# Patient Record
Sex: Female | Born: 1950
Health system: Southern US, Community
[De-identification: ages and names within clinical notes are randomized; demographics above are authoritative.]

## PROBLEM LIST (undated history)

## (undated) DIAGNOSIS — E079 Disorder of thyroid, unspecified: Secondary | ICD-10-CM

## (undated) DIAGNOSIS — E785 Hyperlipidemia, unspecified: Secondary | ICD-10-CM

## (undated) DIAGNOSIS — K649 Unspecified hemorrhoids: Secondary | ICD-10-CM

## (undated) DIAGNOSIS — R0602 Shortness of breath: Secondary | ICD-10-CM

## (undated) HISTORY — DX: Disorder of thyroid, unspecified: E07.9

## (undated) HISTORY — DX: Hyperlipidemia, unspecified: E78.5

## (undated) HISTORY — DX: Shortness of breath: R06.02

## (undated) HISTORY — DX: Unspecified hemorrhoids: K64.9

---

## 1998-10-06 ENCOUNTER — Other Ambulatory Visit: Admission: RE | Admit: 1998-10-06 | Discharge: 1998-10-06 | Payer: Self-pay | Admitting: Obstetrics and Gynecology

## 1999-01-04 ENCOUNTER — Encounter (INDEPENDENT_AMBULATORY_CARE_PROVIDER_SITE_OTHER): Payer: Self-pay | Admitting: Specialist

## 1999-01-04 ENCOUNTER — Other Ambulatory Visit: Admission: RE | Admit: 1999-01-04 | Discharge: 1999-01-04 | Payer: Self-pay | Admitting: Obstetrics and Gynecology

## 2001-01-10 ENCOUNTER — Emergency Department (HOSPITAL_COMMUNITY): Admission: EM | Admit: 2001-01-10 | Discharge: 2001-01-10 | Payer: Self-pay | Admitting: Emergency Medicine

## 2001-01-10 ENCOUNTER — Encounter: Payer: Self-pay | Admitting: Internal Medicine

## 2001-05-21 ENCOUNTER — Other Ambulatory Visit: Admission: RE | Admit: 2001-05-21 | Discharge: 2001-05-21 | Payer: Self-pay | Admitting: Obstetrics and Gynecology

## 2002-08-31 ENCOUNTER — Other Ambulatory Visit: Admission: RE | Admit: 2002-08-31 | Discharge: 2002-08-31 | Payer: Self-pay | Admitting: Obstetrics and Gynecology

## 2004-01-25 ENCOUNTER — Other Ambulatory Visit: Admission: RE | Admit: 2004-01-25 | Discharge: 2004-01-25 | Payer: Self-pay | Admitting: Obstetrics and Gynecology

## 2005-05-08 ENCOUNTER — Other Ambulatory Visit: Admission: RE | Admit: 2005-05-08 | Discharge: 2005-05-08 | Payer: Self-pay | Admitting: Obstetrics and Gynecology

## 2006-08-12 ENCOUNTER — Other Ambulatory Visit: Admission: RE | Admit: 2006-08-12 | Discharge: 2006-08-12 | Payer: Self-pay | Admitting: Obstetrics and Gynecology

## 2007-10-08 ENCOUNTER — Encounter: Admission: RE | Admit: 2007-10-08 | Discharge: 2007-10-08 | Payer: Self-pay | Admitting: Neurological Surgery

## 2008-02-24 ENCOUNTER — Other Ambulatory Visit: Admission: RE | Admit: 2008-02-24 | Discharge: 2008-02-24 | Payer: Self-pay | Admitting: Obstetrics and Gynecology

## 2010-09-04 ENCOUNTER — Other Ambulatory Visit (HOSPITAL_COMMUNITY)
Admission: RE | Admit: 2010-09-04 | Discharge: 2010-09-04 | Disposition: A | Discharge status: Home / Self Care | Payer: BC Managed Care – PPO | Source: Ambulatory Visit | Attending: Obstetrics and Gynecology | Admitting: Obstetrics and Gynecology

## 2010-09-04 ENCOUNTER — Encounter (INDEPENDENT_AMBULATORY_CARE_PROVIDER_SITE_OTHER): Payer: BC Managed Care – PPO | Admitting: Obstetrics and Gynecology

## 2010-09-04 ENCOUNTER — Other Ambulatory Visit: Payer: Self-pay | Admitting: Obstetrics and Gynecology

## 2010-09-04 DIAGNOSIS — Z01419 Encounter for gynecological examination (general) (routine) without abnormal findings: Secondary | ICD-10-CM

## 2010-09-04 DIAGNOSIS — Z13220 Encounter for screening for lipoid disorders: Secondary | ICD-10-CM

## 2010-09-04 DIAGNOSIS — Z124 Encounter for screening for malignant neoplasm of cervix: Secondary | ICD-10-CM | POA: Insufficient documentation

## 2010-09-29 ENCOUNTER — Other Ambulatory Visit: Payer: BC Managed Care – PPO

## 2010-10-17 ENCOUNTER — Other Ambulatory Visit: Payer: Self-pay | Admitting: *Deleted

## 2010-10-17 DIAGNOSIS — E785 Hyperlipidemia, unspecified: Secondary | ICD-10-CM

## 2010-10-17 MED ORDER — ATORVASTATIN CALCIUM 20 MG PO TABS: 20.0000 mg | ORAL_TABLET | Freq: Every day | ORAL | Status: AC

## 2010-12-08 ENCOUNTER — Other Ambulatory Visit: Payer: Self-pay | Admitting: *Deleted

## 2010-12-08 DIAGNOSIS — E785 Hyperlipidemia, unspecified: Secondary | ICD-10-CM

## 2010-12-11 ENCOUNTER — Other Ambulatory Visit (INDEPENDENT_AMBULATORY_CARE_PROVIDER_SITE_OTHER): Payer: BC Managed Care – PPO | Admitting: *Deleted

## 2010-12-11 DIAGNOSIS — R5383 Other fatigue: Secondary | ICD-10-CM

## 2010-12-11 DIAGNOSIS — R5381 Other malaise: Secondary | ICD-10-CM

## 2010-12-11 DIAGNOSIS — E785 Hyperlipidemia, unspecified: Secondary | ICD-10-CM

## 2010-12-11 LAB — LIPID PANEL
Cholesterol: 179 mg/dL (ref 0–200)
LDL Cholesterol: 87 mg/dL (ref 0–99)
Triglycerides: 106 mg/dL (ref 0.0–149.0)
VLDL: 21.2 mg/dL (ref 0.0–40.0)

## 2010-12-11 LAB — BASIC METABOLIC PANEL
BUN: 18 mg/dL (ref 6–23)
Creatinine, Ser: 1 mg/dL (ref 0.4–1.2)
GFR: 59.65 mL/min — ABNORMAL LOW (ref >60.00)
Potassium: 4.3 mEq/L (ref 3.5–5.1)

## 2010-12-11 LAB — HEPATIC FUNCTION PANEL: Total Bilirubin: 0.6 mg/dL (ref 0.3–1.2)

## 2010-12-15 ENCOUNTER — Telehealth: Payer: Self-pay | Admitting: *Deleted

## 2010-12-15 NOTE — Telephone Encounter (Signed)
Message copied by Adolphus Birchwood on Fri Dec 15, 2010  4:08 PM ------      Message from: Roger Shelter      Created: Fri Dec 15, 2010  1:32 PM       Much improvement, recheck in six months with PCP.

## 2010-12-15 NOTE — Telephone Encounter (Signed)
Left message with lab results and for pt to f/u with PCP in six months for labs.  Pt told to call back with any concerns.

## 2011-02-14 ENCOUNTER — Telehealth: Payer: Self-pay | Admitting: Nurse Practitioner

## 2011-02-14 NOTE — Telephone Encounter (Signed)
Received Auth to Release Med Rec Info to [facility] requesting last 2 years of Notes & Labs, records show the only info in chart are Lab Results for 12/11/2010 within the last 2 year period, faxed Lab Results for 12/11/2010 today to [phone]

## 2011-02-27 ENCOUNTER — Telehealth: Payer: Self-pay | Admitting: Nurse Practitioner

## 2011-02-27 NOTE — Telephone Encounter (Signed)
Received auth to release med rec info for all records for past 2 years to include: OV Notes, Labs.

## 2011-02-27 NOTE — Telephone Encounter (Signed)
Faxed last few years of available OV Notes and Labs to 2007.  Faxed today.

## 2011-11-23 ENCOUNTER — Encounter: Payer: Self-pay | Admitting: Cardiology

## 2011-12-24 ENCOUNTER — Telehealth: Payer: Self-pay | Admitting: Medical Oncology

## 2011-12-24 NOTE — Telephone Encounter (Signed)
Wrong chart

## 2012-04-11 ENCOUNTER — Encounter: Payer: Self-pay | Admitting: Cardiology

## 2012-06-12 ENCOUNTER — Encounter: Payer: Self-pay | Admitting: Gastroenterology

## 2012-07-08 ENCOUNTER — Encounter: Payer: Self-pay | Admitting: Gastroenterology

## 2012-07-08 ENCOUNTER — Ambulatory Visit (INDEPENDENT_AMBULATORY_CARE_PROVIDER_SITE_OTHER): Payer: BC Managed Care – PPO | Admitting: Gastroenterology

## 2012-07-08 VITALS — BP 100/60 | HR 62 | Ht 65.0 in | Wt 151.0 lb

## 2012-07-08 DIAGNOSIS — E079 Disorder of thyroid, unspecified: Secondary | ICD-10-CM

## 2012-07-08 DIAGNOSIS — Z1211 Encounter for screening for malignant neoplasm of colon: Secondary | ICD-10-CM

## 2012-07-08 MED ORDER — PEG-KCL-NACL-NASULF-NA ASC-C 100 G PO SOLR: 1.0000 | Freq: Once | ORAL | Status: DC

## 2012-07-08 NOTE — Progress Notes (Signed)
History of Present Illness:  This is a 62 year old Caucasian female who is completely asymptomatic in terms of any general medical or gastrointestinal problems.  She is followed by Dr. Rodrigo Ran in primary care.  Patient is on Synthroid no other medications.  She specifically denies acid reflux, and history of hepatobiliary problems, bowel or regularity, melena or hematochezia.  Her appetite is good her weight is stable.  She recently did Hemoccult cards which were guaiac negative.  Family history is noncontributory.   I have reviewed this patient's present history, medical and surgical past history, allergies and medications.     ROS: The remainder of the 10 point ROS is negative     Physical Exam: Blood pressure 100/60, pulse 62 and regular, and weight 151 with a BMI of 25.13. General well developed well nourished patient in no acute distress, appearing her stated age Eyes PERRLA, no icterus, fundoscopic exam per opthamologist Skin no lesions noted Neck supple, no adenopathy, no thyroid enlargement, no tenderness Chest clear to percussion and auscultation Heart no significant murmurs, gallops or rubs noted Abdomen no hepatosplenomegaly masses or tenderness, BS normal.  Extremities no acute joint lesions, edema, phlebitis or evidence of cellulitis. Neurologic patient oriented x 3, cranial nerves intact, no focal neurologic deficits noted. Psychological mental status normal and normal affect.  Assessment and plan: I agree this patient is due for screening colonoscopy because of her age and no previous exam.  She has approximate 20% chance of having an adenomatous colon polyp.  I explained the risk and benefits of this procedure in detail, and she is agreed to proceed as planned.  We will prep her with a balanced electrolyte preparation, and use IV propofol sedation with nurse anesthesia in attendance.  Encounter Diagnoses  Name Primary?  . Screening colon Yes  . Special screening for  malignant neoplasms, colon

## 2012-07-08 NOTE — Patient Instructions (Addendum)
You have been scheduled for a colonoscopy with propofol. Please follow written instructions given to you at your visit today.  Please pick up your prep kit at the pharmacy within the next 1-3 days. If you use inhalers (even only as needed) or a CPAP machine, please bring them with you on the day of your procedure. CC:  Rodrigo Ran MD

## 2012-07-16 ENCOUNTER — Ambulatory Visit (AMBULATORY_SURGERY_CENTER): Payer: BC Managed Care – PPO | Admitting: Gastroenterology

## 2012-07-16 ENCOUNTER — Encounter: Payer: Self-pay | Admitting: Gastroenterology

## 2012-07-16 VITALS — BP 117/56 | HR 52 | Temp 98.7°F | Resp 29 | Ht 65.0 in | Wt 151.0 lb

## 2012-07-16 DIAGNOSIS — Z1211 Encounter for screening for malignant neoplasm of colon: Secondary | ICD-10-CM

## 2012-07-16 MED ORDER — SODIUM CHLORIDE 0.9 % IV SOLN: 500.0000 mL | INTRAVENOUS | Status: DC

## 2012-07-16 NOTE — Op Note (Signed)
[facility] [address] [place], [address]   COLONOSCOPY PROCEDURE REPORT  PATIENT: Teresa, Wallace  MR#: [id number] BIRTHDATE: March 19, 1951 , 61  yrs. old GENDER: Female ENDOSCOPIST: Mardella Layman, MD, [facility] REFERRED BY: PROCEDURE DATE:  07/16/2012 PROCEDURE:   Colonoscopy, screening ASA CLASS:   Class II INDICATIONS:Average risk patient for colon cancer. MEDICATIONS: propofol (Diprivan) 150mg  IV  DESCRIPTION OF PROCEDURE:   After the risks and benefits and of the procedure were explained, informed consent was obtained.  A digital rectal exam revealed no abnormalities of the rectum.    The LB [id number]  endoscope was introduced through the anus and advanced to the cecum, which was identified by both the appendix and ileocecal valve .  The quality of the prep was excellent, using MoviPrep .  The instrument was then slowly withdrawn as the colon was fully examined.     COLON FINDINGS: A normal appearing cecum, ileocecal valve, and appendiceal orifice were identified.  The ascending, hepatic flexure, transverse, splenic flexure, descending, sigmoid colon and rectum appeared unremarkable.  No polyps or cancers were seen. Retroflexed views revealed no abnormalities.     The scope was then withdrawn from the patient and the procedure completed.  COMPLICATIONS: There were no complications. ENDOSCOPIC IMPRESSION: Normal colon ...no polyps or cancer noted...  RECOMMENDATIONS: 1.  Continue current medications 2.  Continue current colorectal screening recommendations for "routine risk" patients with a repeat colonoscopy in 10 years.   REPEAT EXAM:  cc:  _______________________________ eSignedMardella Layman, MD, [facility] 07/16/2012 11:39 AM

## 2012-07-16 NOTE — Patient Instructions (Addendum)
Discharge instructions given with verbal understanding. Normal exam. Resume previous medications. YOU HAD AN ENDOSCOPIC PROCEDURE TODAY AT THE [facility]: Refer to the procedure report that was given to you for any specific questions about what was found during the examination.  If the procedure report does not answer your questions, please call your gastroenterologist to clarify.  If you requested that your care partner not be given the details of your procedure findings, then the procedure report has been included in a sealed envelope for you to review at your convenience later.  YOU SHOULD EXPECT: Some feelings of bloating in the abdomen. Passage of more gas than usual.  Walking can help get rid of the air that was put into your GI tract during the procedure and reduce the bloating. If you had a lower endoscopy (such as a colonoscopy or flexible sigmoidoscopy) you may notice spotting of blood in your stool or on the toilet paper. If you underwent a bowel prep for your procedure, then you may not have a normal bowel movement for a few days.  DIET: Your first meal following the procedure should be a light meal and then it is ok to progress to your normal diet.  A half-sandwich or bowl of soup is an example of a good first meal.  Heavy or fried foods are harder to digest and may make you feel nauseous or bloated.  Likewise meals heavy in dairy and vegetables can cause extra gas to form and this can also increase the bloating.  Drink plenty of fluids but you should avoid alcoholic beverages for 24 hours.  ACTIVITY: Your care partner should take you home directly after the procedure.  You should plan to take it easy, moving slowly for the rest of the day.  You can resume normal activity the day after the procedure however you should NOT DRIVE or use heavy machinery for 24 hours (because of the sedation medicines used during the test).    SYMPTOMS TO REPORT IMMEDIATELY: A gastroenterologist  can be reached at any hour.  During normal business hours, 8:30 AM to 5:00 PM Monday through Friday, call [phone].  After hours and on weekends, please call the GI answering service at [phone] who will take a message and have the physician on call contact you.   Following lower endoscopy (colonoscopy or flexible sigmoidoscopy):  Excessive amounts of blood in the stool  Significant tenderness or worsening of abdominal pains  Swelling of the abdomen that is new, acute  Fever of 100F or higher  FOLLOW UP: If any biopsies were taken you will be contacted by phone or by letter within the next 1-3 weeks.  Call your gastroenterologist if you have not heard about the biopsies in 3 weeks.  Our staff will call the home number listed on your records the next business day following your procedure to check on you and address any questions or concerns that you may have at that time regarding the information given to you following your procedure. This is a courtesy call and so if there is no answer at the home number and we have not heard from you through the emergency physician on call, we will assume that you have returned to your regular daily activities without incident.  SIGNATURES/CONFIDENTIALITY: You and/or your care partner have signed paperwork which will be entered into your electronic medical record.  These signatures attest to the fact that that the information above on your After Visit Summary has been reviewed   and is understood.  Full responsibility of the confidentiality of this discharge information lies with you and/or your care-partner. 

## 2012-07-16 NOTE — Progress Notes (Signed)
Patient did not experience any of the following events: a burn prior to discharge; a fall within the facility; wrong site/side/patient/procedure/implant event; or a hospital transfer or hospital admission upon discharge from the facility. (G8907) Patient did not have preoperative order for IV antibiotic SSI prophylaxis. (G8918)  

## 2012-07-16 NOTE — Progress Notes (Signed)
Lidocaine-40mg IV prior to Propofol InductionPropofol given over incremental dosages 

## 2012-07-17 ENCOUNTER — Telehealth: Payer: Self-pay

## 2012-07-17 NOTE — Telephone Encounter (Signed)
  Follow up Call-  Call back number 07/16/2012  Post procedure Call Back phone  # [phone]  Permission to leave phone message Yes     Patient questions:  Do you have a fever, pain , or abdominal swelling? no Pain Score  0 *  Have you tolerated food without any problems? yes  Have you been able to return to your normal activities? yes  Do you have any questions about your discharge instructions: Diet   no Medications  no Follow up visit  no  Do you have questions or concerns about your Care? no  Actions: * If pain score is 4 or above: No action needed, pain <4.

## 2016-07-17 ENCOUNTER — Encounter: Payer: Self-pay | Admitting: Obstetrics & Gynecology

## 2016-07-17 ENCOUNTER — Ambulatory Visit (INDEPENDENT_AMBULATORY_CARE_PROVIDER_SITE_OTHER): Payer: BLUE CROSS/BLUE SHIELD | Admitting: Obstetrics & Gynecology

## 2016-07-17 VITALS — BP 110/66 | HR 64 | Resp 14 | Ht 64.0 in | Wt 151.0 lb

## 2016-07-17 DIAGNOSIS — E785 Hyperlipidemia, unspecified: Secondary | ICD-10-CM

## 2016-07-17 DIAGNOSIS — Z01419 Encounter for gynecological examination (general) (routine) without abnormal findings: Secondary | ICD-10-CM | POA: Diagnosis not present

## 2016-07-17 DIAGNOSIS — E039 Hypothyroidism, unspecified: Secondary | ICD-10-CM | POA: Diagnosis not present

## 2016-07-17 DIAGNOSIS — Z124 Encounter for screening for malignant neoplasm of cervix: Secondary | ICD-10-CM | POA: Diagnosis not present

## 2016-07-17 NOTE — Progress Notes (Signed)
66 y.o. G2P2 MarriedCaucasianF here for annual exam. Here to establish care.  Former patient of Dr. Cherylann Banas.    PCP:  Dr. Joylene Draft  No LMP recorded. Patient is postmenopausal.          Sexually active: Yes.    The current method of family planning is post menopausal status.    Exercising: Yes.    weights Smoker:  no  Health Maintenance: Pap:  unsure History of abnormal Pap:  no MMG:  07/20/15 at [facility]  Colonoscopy:  07/16/12 normal repeat 10 years, Dr. Sharlett Iles BMD:   2015 with [facility]  TDaP:  2013 Pneumonia vaccine(s):  never  Zostavax:   PCP Hep C testing: PCP Screening Labs: PCP, Hb today: PCP, Urine today: PCP   reports that she has never smoked. She has never used smokeless tobacco. She reports that she drinks alcohol. She reports that she does not use drugs.  Past Medical History:  Diagnosis Date  . Hyperlipemia   . SOB (shortness of breath)    Mild. Associated with palpitations possibly associated anxiety.  . Thyroid disorder     History reviewed. No pertinent surgical history.  Current Outpatient Prescriptions  Medication Sig Dispense Refill  . atorvastatin (LIPITOR) 20 MG tablet Take 1 tablet (20 mg total) by mouth daily. 30 tablet 3  . levothyroxine (LEVOTHROID) 25 MCG tablet Take 25 mcg by mouth daily.     No current facility-administered medications for this visit.     Family History  Problem Relation Age of Onset  . Breast cancer Mother   . Cancer Mother 24    lung cancer  . Breast cancer Paternal Aunt   . Breast cancer Maternal Grandmother 39  . Breast cancer Paternal Grandmother 33  . Breast cancer Paternal Aunt   . Breast cancer Maternal Aunt   . Breast cancer Maternal Aunt     ROS:  Pertinent items are noted in HPI.  Otherwise, a comprehensive ROS was negative.  Exam:   BP 110/66 (BP Location: Right Arm, Patient Position: Sitting, Cuff Size: Normal)   Pulse 64   Resp 14   Ht 5\' 4"  (1.626 m)   Wt 151 lb (68.5 kg)   BMI 25.92 kg/m      Height: 5\' 4"  (162.6 cm)  Ht Readings from Last 3 Encounters:  07/17/16 5\' 4"  (1.626 m)  07/16/12 5\' 5"  (1.651 m)  07/08/12 5\' 5"  (1.651 m)    General appearance: alert, cooperative and appears stated age Head: Normocephalic, without obvious abnormality, atraumatic Neck: no adenopathy, supple, symmetrical, trachea midline and thyroid normal to inspection and palpation Lungs: clear to auscultation bilaterally Breasts: normal appearance, no masses or tenderness Heart: regular rate and rhythm Abdomen: soft, non-tender; bowel sounds normal; no masses,  no organomegaly Extremities: extremities normal, atraumatic, no cyanosis or edema Skin: Skin color, texture, turgor normal. No rashes or lesions Lymph nodes: Cervical, supraclavicular, and axillary nodes normal. No abnormal inguinal nodes palpated Neurologic: Grossly normal   Pelvic: External genitalia:  no lesions              Urethra:  normal appearing urethra with no masses, tenderness or lesions              Bartholins and Skenes: normal                 Vagina: normal appearing vagina with normal color and discharge, no lesions              Cervix: no lesions  Pap taken: Yes.   Bimanual Exam:  Uterus:  normal size, contour, position, consistency, mobility, non-tender              Adnexa: normal adnexa and no mass, fullness, tenderness               Rectovaginal: Confirms               Anus:  normal sphincter tone, no lesions  Chaperone was present for exam.  A:  Well Woman with normal exam PMP, no HRT Strong family hx of breast cancer (at least 7 family member on both mother's and father's side)   P:   Mammogram guidelines reviewed.  Pt doing 3D MMG.   Pap smear and HR HPV obtained today  Labs and vaccines done with Dr. Joylene Draft.  She will check about whether she's had Hep C testing D/W pt genetic testing for strong family hx of breast cancer.  States she will consider this and let me know if desires  referral. return annually or prn

## 2016-07-17 NOTE — Patient Instructions (Signed)
Make sure you've had the Hep C testing done.  If not, Dr. Joylene Draft can do this or our office.

## 2016-07-20 LAB — IPS PAP TEST WITH HPV

## 2017-02-13 DIAGNOSIS — G245 Blepharospasm: Secondary | ICD-10-CM | POA: Diagnosis not present

## 2017-03-14 DIAGNOSIS — E038 Other specified hypothyroidism: Secondary | ICD-10-CM | POA: Diagnosis not present

## 2017-03-14 DIAGNOSIS — E784 Other hyperlipidemia: Secondary | ICD-10-CM | POA: Diagnosis not present

## 2017-03-18 ENCOUNTER — Ambulatory Visit (HOSPITAL_COMMUNITY): Payer: Medicare Other | Attending: Cardiology

## 2017-03-18 ENCOUNTER — Other Ambulatory Visit: Payer: Self-pay | Admitting: Internal Medicine

## 2017-03-18 ENCOUNTER — Other Ambulatory Visit: Payer: Self-pay

## 2017-03-18 DIAGNOSIS — L988 Other specified disorders of the skin and subcutaneous tissue: Secondary | ICD-10-CM | POA: Diagnosis not present

## 2017-03-18 DIAGNOSIS — I081 Rheumatic disorders of both mitral and tricuspid valves: Secondary | ICD-10-CM | POA: Insufficient documentation

## 2017-03-18 DIAGNOSIS — R011 Cardiac murmur, unspecified: Secondary | ICD-10-CM | POA: Diagnosis not present

## 2017-03-18 DIAGNOSIS — Z1231 Encounter for screening mammogram for malignant neoplasm of breast: Secondary | ICD-10-CM | POA: Diagnosis not present

## 2017-03-18 DIAGNOSIS — Z803 Family history of malignant neoplasm of breast: Secondary | ICD-10-CM | POA: Diagnosis not present

## 2017-03-18 DIAGNOSIS — Z6825 Body mass index (BMI) 25.0-25.9, adult: Secondary | ICD-10-CM | POA: Diagnosis not present

## 2017-03-18 DIAGNOSIS — R002 Palpitations: Secondary | ICD-10-CM | POA: Diagnosis not present

## 2017-03-18 DIAGNOSIS — R0789 Other chest pain: Secondary | ICD-10-CM | POA: Diagnosis not present

## 2017-03-18 DIAGNOSIS — Z1389 Encounter for screening for other disorder: Secondary | ICD-10-CM | POA: Diagnosis not present

## 2017-03-18 DIAGNOSIS — E784 Other hyperlipidemia: Secondary | ICD-10-CM | POA: Diagnosis not present

## 2017-03-18 DIAGNOSIS — Z0000 Encounter for general adult medical examination without abnormal findings: Secondary | ICD-10-CM | POA: Diagnosis not present

## 2017-03-18 DIAGNOSIS — E038 Other specified hypothyroidism: Secondary | ICD-10-CM | POA: Diagnosis not present

## 2017-03-22 ENCOUNTER — Other Ambulatory Visit (HOSPITAL_COMMUNITY): Payer: Self-pay

## 2017-07-18 DIAGNOSIS — H524 Presbyopia: Secondary | ICD-10-CM | POA: Diagnosis not present

## 2017-07-18 DIAGNOSIS — H52203 Unspecified astigmatism, bilateral: Secondary | ICD-10-CM | POA: Diagnosis not present

## 2017-07-18 DIAGNOSIS — H2513 Age-related nuclear cataract, bilateral: Secondary | ICD-10-CM | POA: Diagnosis not present

## 2017-07-18 DIAGNOSIS — Z780 Asymptomatic menopausal state: Secondary | ICD-10-CM | POA: Diagnosis not present

## 2017-10-17 ENCOUNTER — Other Ambulatory Visit: Payer: Self-pay

## 2017-10-17 ENCOUNTER — Encounter: Payer: Self-pay | Admitting: Obstetrics & Gynecology

## 2017-10-17 ENCOUNTER — Ambulatory Visit (INDEPENDENT_AMBULATORY_CARE_PROVIDER_SITE_OTHER): Payer: Medicare Other | Admitting: Obstetrics & Gynecology

## 2017-10-17 VITALS — BP 110/60 | HR 70 | Resp 16 | Ht 64.0 in | Wt 154.0 lb

## 2017-10-17 DIAGNOSIS — Z01419 Encounter for gynecological examination (general) (routine) without abnormal findings: Secondary | ICD-10-CM

## 2017-10-17 DIAGNOSIS — Z124 Encounter for screening for malignant neoplasm of cervix: Secondary | ICD-10-CM | POA: Diagnosis not present

## 2017-10-17 MED ORDER — LIDOCAINE 5 % EX OINT: 1.0000 "application " | TOPICAL_OINTMENT | Freq: Three times a day (TID) | CUTANEOUS | 0 refills | Status: DC

## 2017-10-17 MED ORDER — HYDROCORTISONE 1 % RE CREA: TOPICAL_CREAM | RECTAL | 1 refills | Status: DC

## 2017-10-17 NOTE — Progress Notes (Signed)
67 y.o. G2P0 MarriedCaucasianF here for annual exam.  Doing well.  Denies vaginal bleeding.  Having some issues with hemorrhoids.  Has experienced a little rectal bleeding this week.  PCP:  Dr. Joylene Draft.  Had blood work done in January.    No LMP recorded. Patient is postmenopausal.          Sexually active: Yes.    The current method of family planning is post menopausal status.    Exercising: Yes.    weights Smoker:  no  Health Maintenance: Pap:  07/17/16 Neg. HR HPV:neg   09/04/10 Neg  History of abnormal Pap:  no MMG: 03/18/17 Normal [facility].   Colonoscopy:  07/16/12 Normal. F/u 10 years BMD:   07/2017 Normal TDaP:  2013  Pneumonia vaccine(s):  Started with Dr. Joylene Draft Shingrix: Zostavax done  Hep C testing: PCP Screening Labs: PCP   reports that she has never smoked. She has never used smokeless tobacco. She reports that she drinks alcohol. She reports that she does not use drugs.  Past Medical History:  Diagnosis Date  . Hemorrhoids   . Hyperlipemia   . SOB (shortness of breath)    Mild. Associated with palpitations possibly associated anxiety.  . Thyroid disorder     History reviewed. No pertinent surgical history.  Current Outpatient Medications  Medication Sig Dispense Refill  . atorvastatin (LIPITOR) 20 MG tablet Take 1 tablet (20 mg total) by mouth daily. 30 tablet 3   No current facility-administered medications for this visit.     Family History  Problem Relation Age of Onset  . Breast cancer Mother   . Lung cancer Mother 19  . Breast cancer Paternal Aunt   . Breast cancer Maternal Grandmother 61  . Breast cancer Paternal Grandmother 59  . Breast cancer Paternal Aunt   . Breast cancer Maternal Aunt   . Breast cancer Maternal Aunt     Review of Systems  All other systems reviewed and are negative.   Exam:   BP 110/60 (BP Location: Right Arm, Patient Position: Sitting, Cuff Size: Large)   Pulse 70   Resp 16   Ht 5\' 4"  (1.626 m)   Wt 154 lb (69.9 kg)    BMI 26.43 kg/m     Height: 5\' 4"  (162.6 cm)  Ht Readings from Last 3 Encounters:  10/17/17 5\' 4"  (1.626 m)  07/17/16 5\' 4"  (1.626 m)  07/16/12 5\' 5"  (1.651 m)    General appearance: alert, cooperative and appears stated age Head: Normocephalic, without obvious abnormality, atraumatic Neck: no adenopathy, supple, symmetrical, trachea midline and thyroid normal to inspection and palpation Lungs: clear to auscultation bilaterally Breasts: normal appearance, no masses or tenderness Heart: regular rate and rhythm Abdomen: soft, non-tender; bowel sounds normal; no masses,  no organomegaly Extremities: extremities normal, atraumatic, no cyanosis or edema Skin: Skin color, texture, turgor normal. No rashes or lesions Lymph nodes: Cervical, supraclavicular, and axillary nodes normal. No abnormal inguinal nodes palpated Neurologic: Grossly normal  Pelvic: External genitalia:  no lesions              Urethra:  normal appearing urethra with no masses, tenderness or lesions              Bartholins and Skenes: normal                 Vagina: normal appearing vagina with normal color and discharge, no lesions              Cervix: no  lesions              Pap taken: No. Bimanual Exam:  Uterus:  normal size, contour, position, consistency, mobility, non-tender              Adnexa: normal adnexa and no mass, fullness, tenderness               Rectovaginal: Confirms               Anus:  normal sphincter tone, hemorrhoids  Chaperone was present for exam.  A:  Well Woman with normal exam PMP, no HRT Strong family hx of breast cancer.  [employer] model done today.  15.2% lifetime risk of breast cancer done today based on family members ages given today.  Pt guessed about several family members ages.    P:   Mammogram guidelines reviewed.   pap smear 2018.  Not indicated today D/w pt genetic testing.  She wants to discuss this with her daughter.   Return annually or prn

## 2017-11-01 DIAGNOSIS — Z1212 Encounter for screening for malignant neoplasm of rectum: Secondary | ICD-10-CM | POA: Diagnosis not present

## 2018-04-22 DIAGNOSIS — R82998 Other abnormal findings in urine: Secondary | ICD-10-CM | POA: Diagnosis not present

## 2018-04-22 DIAGNOSIS — E038 Other specified hypothyroidism: Secondary | ICD-10-CM | POA: Diagnosis not present

## 2018-04-22 DIAGNOSIS — E7849 Other hyperlipidemia: Secondary | ICD-10-CM | POA: Diagnosis not present

## 2018-04-29 DIAGNOSIS — Z23 Encounter for immunization: Secondary | ICD-10-CM | POA: Diagnosis not present

## 2018-04-29 DIAGNOSIS — R011 Cardiac murmur, unspecified: Secondary | ICD-10-CM | POA: Diagnosis not present

## 2018-04-29 DIAGNOSIS — E7849 Other hyperlipidemia: Secondary | ICD-10-CM | POA: Diagnosis not present

## 2018-04-29 DIAGNOSIS — E038 Other specified hypothyroidism: Secondary | ICD-10-CM | POA: Diagnosis not present

## 2018-04-29 DIAGNOSIS — Z1389 Encounter for screening for other disorder: Secondary | ICD-10-CM | POA: Diagnosis not present

## 2018-04-29 DIAGNOSIS — L988 Other specified disorders of the skin and subcutaneous tissue: Secondary | ICD-10-CM | POA: Diagnosis not present

## 2018-04-29 DIAGNOSIS — R002 Palpitations: Secondary | ICD-10-CM | POA: Diagnosis not present

## 2018-04-29 DIAGNOSIS — Z6825 Body mass index (BMI) 25.0-25.9, adult: Secondary | ICD-10-CM | POA: Diagnosis not present

## 2018-04-29 DIAGNOSIS — D7589 Other specified diseases of blood and blood-forming organs: Secondary | ICD-10-CM | POA: Diagnosis not present

## 2018-04-29 DIAGNOSIS — Z0000 Encounter for general adult medical examination without abnormal findings: Secondary | ICD-10-CM | POA: Diagnosis not present

## 2018-05-07 DIAGNOSIS — Z1212 Encounter for screening for malignant neoplasm of rectum: Secondary | ICD-10-CM | POA: Diagnosis not present

## 2018-07-24 DIAGNOSIS — H2513 Age-related nuclear cataract, bilateral: Secondary | ICD-10-CM | POA: Diagnosis not present

## 2018-07-24 DIAGNOSIS — H52203 Unspecified astigmatism, bilateral: Secondary | ICD-10-CM | POA: Diagnosis not present

## 2018-07-24 DIAGNOSIS — H524 Presbyopia: Secondary | ICD-10-CM | POA: Diagnosis not present

## 2018-10-27 ENCOUNTER — Ambulatory Visit: Payer: Medicare Other | Admitting: Obstetrics & Gynecology

## 2019-01-01 NOTE — Progress Notes (Signed)
68 y.o. G2P0 Married White or Caucasian female here for annual exam.  Doing well.  Denies vaginal bleeding.    PCP:  Dr. Joylene Draft.  Last visit was in october  No LMP recorded. Patient is postmenopausal.          Sexually active: Yes.    The current method of family planning is post menopausal status.    Exercising: Yes.    Golf Smoker:  no  Health Maintenance: Pap:  07/17/16 neg History of abnormal Pap:  no MMG:  03/18/17 bi-rads 1 neg.  Reports she had one scheduled.   Colonoscopy: 07/16/12 normal repeat 10 years, Dr. Sharlett Iles  BMD:  07/2017 normal  TDaP:  2013 Pneumonia vaccine(s):  Started with Dr Joylene Draft per patient  Shingrix:  Completed 2019 Hep C testing: PCP Screening Labs: PCP   reports that she has never smoked. She has never used smokeless tobacco. She reports current alcohol use. She reports that she does not use drugs.  Past Medical History:  Diagnosis Date  . Hemorrhoids   . Hyperlipemia   . SOB (shortness of breath)    Mild. Associated with palpitations possibly associated anxiety.  . Thyroid disorder     No past surgical history on file.  Current Outpatient Medications  Medication Sig Dispense Refill  . atorvastatin (LIPITOR) 20 MG tablet Take 1 tablet (20 mg total) by mouth daily. 30 tablet 3  . hydrocortisone (PROCTO-PAK) 1 % CREA Apply topically up to 4 times daily 28.4 g 1  . lidocaine (XYLOCAINE) 5 % ointment Apply 1 application topically 3 (three) times daily. 1.25 g 0   No current facility-administered medications for this visit.     Family History  Problem Relation Age of Onset  . Breast cancer Mother   . Lung cancer Mother 53  . Breast cancer Paternal Aunt   . Breast cancer Maternal Grandmother 37  . Breast cancer Paternal Grandmother 67  . Breast cancer Paternal Aunt   . Breast cancer Maternal Aunt   . Breast cancer Maternal Aunt     Review of Systems  All other systems reviewed and are negative.   Exam:   Vitals:   01/06/19 1314  BP:  110/64  Pulse: 74  Resp: 14  Temp: 98.3 F (36.8 C)    General appearance: alert, cooperative and appears stated age Head: Normocephalic, without obvious abnormality, atraumatic Neck: no adenopathy, supple, symmetrical, trachea midline and thyroid normal to inspection and palpation Lungs: clear to auscultation bilaterally Breasts: normal appearance, no masses or tenderness Heart: regular rate and rhythm Abdomen: soft, non-tender; bowel sounds normal; no masses,  no organomegaly Extremities: extremities normal, atraumatic, no cyanosis or edema Skin: Skin color, texture, turgor normal. No rashes or lesions Lymph nodes: Cervical, supraclavicular, and axillary nodes normal. No abnormal inguinal nodes palpated Neurologic: Grossly normal   Pelvic: External genitalia:  no lesions              Urethra:  normal appearing urethra with no masses, tenderness or lesions              Bartholins and Skenes: normal                 Vagina: normal appearing vagina with normal color and discharge, no lesions              Cervix: no lesions              Pap taken: Yes.   Bimanual Exam:  Uterus:  normal  size, contour, position, consistency, mobility, non-tender              Adnexa: normal adnexa and no mass, fullness, tenderness               Rectovaginal: Confirms               Anus:  normal sphincter tone, no lesions  Chaperone was present for exam.  A:  Well Woman with normal exam PMP, no HRT Strong family hx of breast cancer.  15.2% lifetime risk of breast cancer.  P:   Mammogram yearly recommended.  She knows this is overdue.  Limited breast MRI discussed.  She is interested in proceeding with this. pap smear obtained today Lab work done 04/2018 with Dr. Joylene Draft Vaccines are UTD Colonoscopy and BMD are UTD Return annually or prn

## 2019-01-06 ENCOUNTER — Other Ambulatory Visit (HOSPITAL_COMMUNITY)
Admission: RE | Admit: 2019-01-06 | Discharge: 2019-01-06 | Disposition: A | Discharge status: Home / Self Care | Payer: Medicare Other | Source: Ambulatory Visit | Attending: Obstetrics & Gynecology | Admitting: Obstetrics & Gynecology

## 2019-01-06 ENCOUNTER — Other Ambulatory Visit: Payer: Self-pay

## 2019-01-06 ENCOUNTER — Encounter: Payer: Self-pay | Admitting: Obstetrics & Gynecology

## 2019-01-06 ENCOUNTER — Ambulatory Visit (INDEPENDENT_AMBULATORY_CARE_PROVIDER_SITE_OTHER): Payer: Medicare Other | Admitting: Obstetrics & Gynecology

## 2019-01-06 VITALS — BP 110/64 | HR 74 | Temp 98.3°F | Resp 14 | Ht 64.25 in | Wt 147.0 lb

## 2019-01-06 DIAGNOSIS — Z124 Encounter for screening for malignant neoplasm of cervix: Secondary | ICD-10-CM | POA: Diagnosis not present

## 2019-01-06 DIAGNOSIS — Z01419 Encounter for gynecological examination (general) (routine) without abnormal findings: Secondary | ICD-10-CM

## 2019-01-07 LAB — CYTOLOGY - PAP: Diagnosis: NEGATIVE

## 2019-01-09 ENCOUNTER — Ambulatory Visit: Payer: Medicare Other | Admitting: Obstetrics & Gynecology

## 2019-04-21 DIAGNOSIS — E038 Other specified hypothyroidism: Secondary | ICD-10-CM | POA: Diagnosis not present

## 2019-04-21 DIAGNOSIS — E7849 Other hyperlipidemia: Secondary | ICD-10-CM | POA: Diagnosis not present

## 2019-04-21 DIAGNOSIS — Z23 Encounter for immunization: Secondary | ICD-10-CM | POA: Diagnosis not present

## 2019-04-23 DIAGNOSIS — R8290 Unspecified abnormal findings in urine: Secondary | ICD-10-CM | POA: Diagnosis not present

## 2019-04-23 DIAGNOSIS — Z1212 Encounter for screening for malignant neoplasm of rectum: Secondary | ICD-10-CM | POA: Diagnosis not present

## 2019-04-27 DIAGNOSIS — R011 Cardiac murmur, unspecified: Secondary | ICD-10-CM | POA: Diagnosis not present

## 2019-04-27 DIAGNOSIS — Z0000 Encounter for general adult medical examination without abnormal findings: Secondary | ICD-10-CM | POA: Diagnosis not present

## 2019-04-27 DIAGNOSIS — G4700 Insomnia, unspecified: Secondary | ICD-10-CM | POA: Diagnosis not present

## 2019-04-27 DIAGNOSIS — Z1331 Encounter for screening for depression: Secondary | ICD-10-CM | POA: Diagnosis not present

## 2019-04-27 DIAGNOSIS — E039 Hypothyroidism, unspecified: Secondary | ICD-10-CM | POA: Diagnosis not present

## 2019-04-27 DIAGNOSIS — E785 Hyperlipidemia, unspecified: Secondary | ICD-10-CM | POA: Diagnosis not present

## 2019-07-02 ENCOUNTER — Encounter

## 2019-07-18 ENCOUNTER — Ambulatory Visit: Payer: Medicare Other | Attending: Internal Medicine

## 2019-07-23 DIAGNOSIS — H2513 Age-related nuclear cataract, bilateral: Secondary | ICD-10-CM | POA: Diagnosis not present

## 2019-08-17 DIAGNOSIS — L821 Other seborrheic keratosis: Secondary | ICD-10-CM | POA: Diagnosis not present

## 2019-08-17 DIAGNOSIS — L570 Actinic keratosis: Secondary | ICD-10-CM | POA: Diagnosis not present

## 2019-08-17 DIAGNOSIS — D485 Neoplasm of uncertain behavior of skin: Secondary | ICD-10-CM | POA: Diagnosis not present

## 2019-10-15 DIAGNOSIS — D485 Neoplasm of uncertain behavior of skin: Secondary | ICD-10-CM | POA: Diagnosis not present

## 2019-10-15 DIAGNOSIS — Z1231 Encounter for screening mammogram for malignant neoplasm of breast: Secondary | ICD-10-CM | POA: Diagnosis not present

## 2019-10-15 DIAGNOSIS — L820 Inflamed seborrheic keratosis: Secondary | ICD-10-CM | POA: Diagnosis not present

## 2019-10-15 DIAGNOSIS — L905 Scar conditions and fibrosis of skin: Secondary | ICD-10-CM | POA: Diagnosis not present

## 2019-10-26 DIAGNOSIS — Z4802 Encounter for removal of sutures: Secondary | ICD-10-CM | POA: Diagnosis not present

## 2020-02-15 DIAGNOSIS — L821 Other seborrheic keratosis: Secondary | ICD-10-CM | POA: Diagnosis not present

## 2020-02-15 DIAGNOSIS — L820 Inflamed seborrheic keratosis: Secondary | ICD-10-CM | POA: Diagnosis not present

## 2020-02-15 DIAGNOSIS — L570 Actinic keratosis: Secondary | ICD-10-CM | POA: Diagnosis not present

## 2020-02-15 DIAGNOSIS — L814 Other melanin hyperpigmentation: Secondary | ICD-10-CM | POA: Diagnosis not present

## 2020-02-15 DIAGNOSIS — D2371 Other benign neoplasm of skin of right lower limb, including hip: Secondary | ICD-10-CM | POA: Diagnosis not present

## 2020-03-11 ENCOUNTER — Ambulatory Visit: Payer: Medicare Other | Admitting: Obstetrics & Gynecology

## 2020-04-05 DIAGNOSIS — Z23 Encounter for immunization: Secondary | ICD-10-CM | POA: Diagnosis not present

## 2020-04-25 DIAGNOSIS — E039 Hypothyroidism, unspecified: Secondary | ICD-10-CM | POA: Diagnosis not present

## 2020-04-25 DIAGNOSIS — E785 Hyperlipidemia, unspecified: Secondary | ICD-10-CM | POA: Diagnosis not present

## 2020-04-28 DIAGNOSIS — R002 Palpitations: Secondary | ICD-10-CM | POA: Diagnosis not present

## 2020-04-28 DIAGNOSIS — Z0000 Encounter for general adult medical examination without abnormal findings: Secondary | ICD-10-CM | POA: Diagnosis not present

## 2020-04-28 DIAGNOSIS — E039 Hypothyroidism, unspecified: Secondary | ICD-10-CM | POA: Diagnosis not present

## 2020-04-28 DIAGNOSIS — R82998 Other abnormal findings in urine: Secondary | ICD-10-CM | POA: Diagnosis not present

## 2020-04-28 DIAGNOSIS — R011 Cardiac murmur, unspecified: Secondary | ICD-10-CM | POA: Diagnosis not present

## 2020-04-28 DIAGNOSIS — R0789 Other chest pain: Secondary | ICD-10-CM | POA: Diagnosis not present

## 2020-04-28 DIAGNOSIS — R945 Abnormal results of liver function studies: Secondary | ICD-10-CM | POA: Diagnosis not present

## 2020-04-28 DIAGNOSIS — G4700 Insomnia, unspecified: Secondary | ICD-10-CM | POA: Diagnosis not present

## 2020-04-28 DIAGNOSIS — Z23 Encounter for immunization: Secondary | ICD-10-CM | POA: Diagnosis not present

## 2020-04-28 DIAGNOSIS — E785 Hyperlipidemia, unspecified: Secondary | ICD-10-CM | POA: Diagnosis not present

## 2020-04-28 DIAGNOSIS — R7301 Impaired fasting glucose: Secondary | ICD-10-CM | POA: Diagnosis not present

## 2020-04-28 NOTE — Progress Notes (Signed)
69 y.o. G2P0 Married White or Caucasian female here for breast and pelvic exam.  I am also following her for strong family hx of breast cancer.  Tyrer Cusick model risk assessment for pt is 15.2%.  We have discussed abbreviated breast MRI.  Denies vaginal bleeding.  No LMP recorded. Patient is postmenopausal.          Sexually active: Yes.    H/O STD:  no  Health Maintenance: PCP:  Crist Infante, MD.  Last wellness appt was 04/28/20.  Did blood work at that appt: yes.  HbA1C  Vaccines are up to date:  Has not done pneumonia vaccination.  Has received her Covid booster Colonoscopy:  07/16/12 normal repeat 10 years, Dr. Sharlett Iles  MMG:  April 2021 per patient with [facility] BMD:  07/2017 normal  Last pap smear:  01/06/19 Neg.   H/o abnormal pap smear:  No   reports that she has never smoked. She has never used smokeless tobacco. She reports current alcohol use. She reports that she does not use drugs.  Past Medical History:  Diagnosis Date  . Hemorrhoids   . Hyperlipemia   . SOB (shortness of breath)    Mild. Associated with palpitations possibly associated anxiety.  . Thyroid disorder     History reviewed. No pertinent surgical history.  Current Outpatient Medications  Medication Sig Dispense Refill  . atorvastatin (LIPITOR) 20 MG tablet Take 1 tablet (20 mg total) by mouth daily. 30 tablet 3   No current facility-administered medications for this visit.    Family History  Problem Relation Age of Onset  . Breast cancer Mother   . Lung cancer Mother 41  . Breast cancer Paternal Aunt   . Breast cancer Maternal Grandmother 25  . Breast cancer Paternal Grandmother 26  . Breast cancer Paternal Aunt   . Breast cancer Maternal Aunt   . Breast cancer Maternal Aunt     Review of Systems  All other systems reviewed and are negative.   Exam:   BP 118/68 (BP Location: Left Arm, Patient Position: Sitting, Cuff Size: Normal)   Pulse 68   Resp 14   Ht 5' 4.75" (1.645 m)   Wt 152 lb  (68.9 kg)   BMI 25.49 kg/m   Height: 5' 4.75" (164.5 cm)  General appearance: alert, cooperative and appears stated age Breasts: normal appearance, no masses or tenderness Abdomen: soft, non-tender; bowel sounds normal; no masses,  no organomegaly Lymph nodes: Cervical, supraclavicular, and axillary nodes normal.  No abnormal inguinal nodes palpated Neurologic: Grossly normal  Pelvic: External genitalia:  no lesions              Urethra:  normal appearing urethra with no masses, tenderness or lesions              Bartholins and Skenes: normal                 Vagina: normal appearing vagina with normal color and discharge, no lesions              Cervix: no lesions              Pap taken: No. Bimanual Exam:  Uterus:  normal size, contour, position, consistency, mobility, non-tender              Adnexa: normal adnexa and no mass, fullness, tenderness               Rectovaginal: Confirms  Anus:  normal sphincter tone, no lesions  Chaperone, Terence Lux, CMA, was present for exam.  A:  Breast and Pelvic exam PMP, no HRT Strong family hx of breast cancer.  15.2% lifetime risk of breast cancer Elevated lipids managed by Dr. Joylene Draft  P:   Mammogram guidelines reviewed.  Abbreviated breast MRI discussed.  Information given. pap smear guidelines reviewed.  We decided together not to continue with Pap smears Lab work and vaccines  Plan return in 2 years for breast and pelvic or sooner with any new problem/concern   25 minutes of total time was spent for this patient encounter, including preparation, face-to-face counseling with the patient and coordination of care, and documentation of the encounter.

## 2020-05-02 ENCOUNTER — Encounter: Payer: Self-pay | Admitting: Obstetrics & Gynecology

## 2020-05-02 ENCOUNTER — Other Ambulatory Visit: Payer: Self-pay

## 2020-05-02 ENCOUNTER — Ambulatory Visit (INDEPENDENT_AMBULATORY_CARE_PROVIDER_SITE_OTHER): Payer: Medicare Other | Admitting: Obstetrics & Gynecology

## 2020-05-02 VITALS — BP 118/68 | HR 68 | Resp 14 | Ht 64.75 in | Wt 152.0 lb

## 2020-05-02 DIAGNOSIS — Z9189 Other specified personal risk factors, not elsewhere classified: Secondary | ICD-10-CM | POA: Diagnosis not present

## 2020-05-02 DIAGNOSIS — Z01411 Encounter for gynecological examination (general) (routine) with abnormal findings: Secondary | ICD-10-CM

## 2020-08-03 DIAGNOSIS — H2513 Age-related nuclear cataract, bilateral: Secondary | ICD-10-CM | POA: Diagnosis not present

## 2020-09-08 DIAGNOSIS — L814 Other melanin hyperpigmentation: Secondary | ICD-10-CM | POA: Diagnosis not present

## 2020-09-08 DIAGNOSIS — D1801 Hemangioma of skin and subcutaneous tissue: Secondary | ICD-10-CM | POA: Diagnosis not present

## 2020-09-08 DIAGNOSIS — L821 Other seborrheic keratosis: Secondary | ICD-10-CM | POA: Diagnosis not present

## 2020-09-08 DIAGNOSIS — L570 Actinic keratosis: Secondary | ICD-10-CM | POA: Diagnosis not present

## 2020-10-05 DIAGNOSIS — E785 Hyperlipidemia, unspecified: Secondary | ICD-10-CM | POA: Diagnosis not present

## 2020-10-05 DIAGNOSIS — Z23 Encounter for immunization: Secondary | ICD-10-CM | POA: Diagnosis not present

## 2020-10-23 DIAGNOSIS — J320 Chronic maxillary sinusitis: Secondary | ICD-10-CM | POA: Diagnosis not present

## 2020-11-05 DIAGNOSIS — Z20822 Contact with and (suspected) exposure to covid-19: Secondary | ICD-10-CM | POA: Diagnosis not present

## 2020-11-07 DIAGNOSIS — Z20822 Contact with and (suspected) exposure to covid-19: Secondary | ICD-10-CM | POA: Diagnosis not present

## 2020-11-21 DIAGNOSIS — Z1231 Encounter for screening mammogram for malignant neoplasm of breast: Secondary | ICD-10-CM | POA: Diagnosis not present

## 2020-12-22 ENCOUNTER — Encounter (HOSPITAL_BASED_OUTPATIENT_CLINIC_OR_DEPARTMENT_OTHER): Payer: Self-pay | Admitting: Obstetrics & Gynecology

## 2021-04-03 DIAGNOSIS — C44619 Basal cell carcinoma of skin of left upper limb, including shoulder: Secondary | ICD-10-CM | POA: Diagnosis not present

## 2021-04-03 DIAGNOSIS — C44519 Basal cell carcinoma of skin of other part of trunk: Secondary | ICD-10-CM | POA: Diagnosis not present

## 2021-04-03 DIAGNOSIS — Z85828 Personal history of other malignant neoplasm of skin: Secondary | ICD-10-CM | POA: Diagnosis not present

## 2021-04-03 DIAGNOSIS — L821 Other seborrheic keratosis: Secondary | ICD-10-CM | POA: Diagnosis not present

## 2021-04-03 DIAGNOSIS — L578 Other skin changes due to chronic exposure to nonionizing radiation: Secondary | ICD-10-CM | POA: Diagnosis not present

## 2021-04-03 DIAGNOSIS — L570 Actinic keratosis: Secondary | ICD-10-CM | POA: Diagnosis not present

## 2021-04-03 DIAGNOSIS — L819 Disorder of pigmentation, unspecified: Secondary | ICD-10-CM | POA: Diagnosis not present

## 2021-04-03 DIAGNOSIS — L820 Inflamed seborrheic keratosis: Secondary | ICD-10-CM | POA: Diagnosis not present

## 2021-04-29 DIAGNOSIS — Z23 Encounter for immunization: Secondary | ICD-10-CM | POA: Diagnosis not present

## 2021-05-05 DIAGNOSIS — N390 Urinary tract infection, site not specified: Secondary | ICD-10-CM | POA: Diagnosis not present

## 2021-06-08 DIAGNOSIS — Z23 Encounter for immunization: Secondary | ICD-10-CM | POA: Diagnosis not present

## 2021-07-28 DIAGNOSIS — E785 Hyperlipidemia, unspecified: Secondary | ICD-10-CM | POA: Diagnosis not present

## 2021-07-28 DIAGNOSIS — E039 Hypothyroidism, unspecified: Secondary | ICD-10-CM | POA: Diagnosis not present

## 2021-08-01 DIAGNOSIS — Z1331 Encounter for screening for depression: Secondary | ICD-10-CM | POA: Diagnosis not present

## 2021-08-01 DIAGNOSIS — G4700 Insomnia, unspecified: Secondary | ICD-10-CM | POA: Diagnosis not present

## 2021-08-01 DIAGNOSIS — Z1339 Encounter for screening examination for other mental health and behavioral disorders: Secondary | ICD-10-CM | POA: Diagnosis not present

## 2021-08-01 DIAGNOSIS — Z0000 Encounter for general adult medical examination without abnormal findings: Secondary | ICD-10-CM | POA: Diagnosis not present

## 2021-08-01 DIAGNOSIS — E039 Hypothyroidism, unspecified: Secondary | ICD-10-CM | POA: Diagnosis not present

## 2021-08-01 DIAGNOSIS — E785 Hyperlipidemia, unspecified: Secondary | ICD-10-CM | POA: Diagnosis not present

## 2021-08-01 DIAGNOSIS — R011 Cardiac murmur, unspecified: Secondary | ICD-10-CM | POA: Diagnosis not present

## 2021-08-01 DIAGNOSIS — R82998 Other abnormal findings in urine: Secondary | ICD-10-CM | POA: Diagnosis not present

## 2021-08-02 ENCOUNTER — Other Ambulatory Visit: Payer: Self-pay | Admitting: Internal Medicine

## 2021-08-02 DIAGNOSIS — E785 Hyperlipidemia, unspecified: Secondary | ICD-10-CM

## 2021-08-09 DIAGNOSIS — H25813 Combined forms of age-related cataract, bilateral: Secondary | ICD-10-CM | POA: Diagnosis not present

## 2021-08-28 ENCOUNTER — Other Ambulatory Visit: Payer: 59

## 2021-09-19 ENCOUNTER — Ambulatory Visit
Admission: RE | Admit: 2021-09-19 | Discharge: 2021-09-19 | Disposition: A | Discharge status: Home / Self Care | Payer: No Typology Code available for payment source | Source: Ambulatory Visit | Attending: Internal Medicine | Admitting: Internal Medicine

## 2021-09-19 DIAGNOSIS — E785 Hyperlipidemia, unspecified: Secondary | ICD-10-CM

## 2021-10-20 DIAGNOSIS — Z1212 Encounter for screening for malignant neoplasm of rectum: Secondary | ICD-10-CM | POA: Diagnosis not present

## 2021-12-06 DIAGNOSIS — Z1231 Encounter for screening mammogram for malignant neoplasm of breast: Secondary | ICD-10-CM | POA: Diagnosis not present

## 2022-03-30 ENCOUNTER — Other Ambulatory Visit (HOSPITAL_BASED_OUTPATIENT_CLINIC_OR_DEPARTMENT_OTHER): Payer: Self-pay | Admitting: Obstetrics & Gynecology

## 2022-03-30 ENCOUNTER — Ambulatory Visit (INDEPENDENT_AMBULATORY_CARE_PROVIDER_SITE_OTHER): Payer: Medicare Other

## 2022-03-30 DIAGNOSIS — R300 Dysuria: Secondary | ICD-10-CM

## 2022-03-30 DIAGNOSIS — R3129 Other microscopic hematuria: Secondary | ICD-10-CM

## 2022-03-30 DIAGNOSIS — R3915 Urgency of urination: Secondary | ICD-10-CM | POA: Diagnosis not present

## 2022-03-30 LAB — POCT URINALYSIS DIPSTICK
Bilirubin, UA: NEGATIVE
Glucose, UA: NEGATIVE
Ketones, UA: NEGATIVE
Nitrite, UA: NEGATIVE
Protein, UA: POSITIVE — AB
Spec Grav, UA: >=1.03 — AB (ref 1.010–1.025)
Urobilinogen, UA: 0.2 E.U./dL
pH, UA: 5.5 (ref 5.0–8.0)

## 2022-03-30 MED ORDER — NITROFURANTOIN MONOHYD MACRO 100 MG PO CAPS: 100.0000 mg | ORAL_CAPSULE | Freq: Two times a day (BID) | ORAL | 0 refills | Status: DC

## 2022-03-30 MED ORDER — PHENAZOPYRIDINE HCL 200 MG PO TABS: 200.0000 mg | ORAL_TABLET | Freq: Three times a day (TID) | ORAL | 0 refills | Status: DC | PRN

## 2022-03-30 NOTE — Progress Notes (Signed)
Patient came in today to give a urine sample to be tested for UTI. Patient states that she has had some urinary urgency with a little pain. She is also concerned with the odor that is present. Urine evaluated and sent off for culture.  Per protocol, I sent to the requested pharmacy Macrobid '100mg'$  BID x5 days and Pyridium '200mg'$  TID X2 days. tbw

## 2022-04-02 LAB — URINE CULTURE

## 2022-04-02 NOTE — Addendum Note (Signed)
Addended by: Megan Salon on: 04/02/2022 05:47 PM   Modules accepted: Orders

## 2022-04-06 ENCOUNTER — Ambulatory Visit (HOSPITAL_BASED_OUTPATIENT_CLINIC_OR_DEPARTMENT_OTHER): Payer: 59

## 2022-04-07 ENCOUNTER — Other Ambulatory Visit (HOSPITAL_BASED_OUTPATIENT_CLINIC_OR_DEPARTMENT_OTHER): Payer: Self-pay | Admitting: Obstetrics & Gynecology

## 2022-04-07 DIAGNOSIS — R3129 Other microscopic hematuria: Secondary | ICD-10-CM

## 2022-04-11 ENCOUNTER — Ambulatory Visit (INDEPENDENT_AMBULATORY_CARE_PROVIDER_SITE_OTHER): Payer: Medicare Other

## 2022-04-11 DIAGNOSIS — R3129 Other microscopic hematuria: Secondary | ICD-10-CM | POA: Diagnosis not present

## 2022-04-11 LAB — POCT URINALYSIS DIPSTICK
Bilirubin, UA: NEGATIVE
Blood, UA: NEGATIVE
Glucose, UA: NEGATIVE
Ketones, UA: NEGATIVE
Leukocytes, UA: NEGATIVE
Nitrite, UA: NEGATIVE
Protein, UA: NEGATIVE
Spec Grav, UA: 1.02 (ref 1.010–1.025)
Urobilinogen, UA: 0.2 E.U./dL
pH, UA: 7 (ref 5.0–8.0)

## 2022-04-11 NOTE — Progress Notes (Signed)
Patient came in today to have urine rechecked. We were checking for hematuria. Urine dip was negative. Patient states she has had no symptoms after completion of antibiotics. Urine sent off to the lab for a urine microscopic. tbw

## 2022-04-12 LAB — URINALYSIS, MICROSCOPIC ONLY
Bacteria, UA: NONE SEEN
Casts: NONE SEEN /lpf
Epithelial Cells (non renal): NONE SEEN /hpf (ref 0–10)
RBC, Urine: NONE SEEN /hpf (ref 0–2)
WBC, UA: NONE SEEN /hpf (ref 0–5)

## 2022-04-17 DIAGNOSIS — Z23 Encounter for immunization: Secondary | ICD-10-CM | POA: Diagnosis not present

## 2022-04-19 DIAGNOSIS — Z85828 Personal history of other malignant neoplasm of skin: Secondary | ICD-10-CM | POA: Diagnosis not present

## 2022-04-19 DIAGNOSIS — D1801 Hemangioma of skin and subcutaneous tissue: Secondary | ICD-10-CM | POA: Diagnosis not present

## 2022-04-19 DIAGNOSIS — L570 Actinic keratosis: Secondary | ICD-10-CM | POA: Diagnosis not present

## 2022-04-19 DIAGNOSIS — L819 Disorder of pigmentation, unspecified: Secondary | ICD-10-CM | POA: Diagnosis not present

## 2022-04-19 DIAGNOSIS — C44519 Basal cell carcinoma of skin of other part of trunk: Secondary | ICD-10-CM | POA: Diagnosis not present

## 2022-04-19 DIAGNOSIS — L821 Other seborrheic keratosis: Secondary | ICD-10-CM | POA: Diagnosis not present

## 2022-06-01 ENCOUNTER — Ambulatory Visit (HOSPITAL_BASED_OUTPATIENT_CLINIC_OR_DEPARTMENT_OTHER): Payer: 59 | Admitting: Obstetrics & Gynecology

## 2022-07-24 ENCOUNTER — Encounter (HOSPITAL_BASED_OUTPATIENT_CLINIC_OR_DEPARTMENT_OTHER): Payer: Self-pay | Admitting: Obstetrics & Gynecology

## 2022-07-24 ENCOUNTER — Ambulatory Visit (INDEPENDENT_AMBULATORY_CARE_PROVIDER_SITE_OTHER): Payer: Medicare Other | Admitting: Obstetrics & Gynecology

## 2022-07-24 VITALS — BP 111/50 | HR 55 | Ht 64.0 in | Wt 147.2 lb

## 2022-07-24 DIAGNOSIS — Z01419 Encounter for gynecological examination (general) (routine) without abnormal findings: Secondary | ICD-10-CM | POA: Diagnosis not present

## 2022-07-24 DIAGNOSIS — Z1211 Encounter for screening for malignant neoplasm of colon: Secondary | ICD-10-CM

## 2022-07-24 DIAGNOSIS — Z87440 Personal history of urinary (tract) infections: Secondary | ICD-10-CM | POA: Insufficient documentation

## 2022-07-24 DIAGNOSIS — Z803 Family history of malignant neoplasm of breast: Secondary | ICD-10-CM

## 2022-07-24 MED ORDER — ESTRADIOL 0.1 MG/GM VA CREA: TOPICAL_CREAM | VAGINAL | 1 refills | Status: DC

## 2022-07-24 MED ORDER — NITROFURANTOIN MONOHYD MACRO 100 MG PO CAPS: 100.0000 mg | ORAL_CAPSULE | Freq: Two times a day (BID) | ORAL | 0 refills | Status: DC

## 2022-07-24 NOTE — Progress Notes (Signed)
72 y.o. G2P0 Married White or Caucasian female here for breast and pelvic exam.  I am also following her for postmenopausal status.  Strong history of breast cancer.  Screening MRI discussed.  Denies vaginal bleeding.  She is going to [place] this next month.  Has had some more recent UTIs and would like RX to take with her.    Estrogen cream for prevention of UTIs discussed.    No LMP recorded. Patient is postmenopausal.          Sexually active: Yes.    H/O STD:  no  Health Maintenance: PCP:  Dr. Joylene Draft.  Last wellness appt was about a year ago.  Has appt in February.  Vaccines are up to date:  pt is sure her vaccines are up to date Colonoscopy:  07/16/2012, follow up 10 years. MMG:  12/06/2021 at [facility], Negative per pt BMD:  07/2017.  She will discuss with Dr. Joylene Draft about this having this repeated this year. Last pap smear:  01/06/2019 Negative.   H/o abnormal pap smear:  No    reports that she has never smoked. She has never used smokeless tobacco. She reports current alcohol use. She reports that she does not use drugs.  Past Medical History:  Diagnosis Date   Hemorrhoids    Hyperlipemia    Thyroid disorder    mildly elevated TSH    No past surgical history on file.  Current Outpatient Medications  Medication Sig Dispense Refill   atorvastatin (LIPITOR) 20 MG tablet Take 1 tablet (20 mg total) by mouth daily. (Patient not taking: Reported on 07/24/2022) 30 tablet 3   nitrofurantoin, macrocrystal-monohydrate, (MACROBID) 100 MG capsule Take 1 capsule (100 mg total) by mouth 2 (two) times daily. 10 capsule 0   No current facility-administered medications for this visit.    Family History  Problem Relation Age of Onset   Breast cancer Mother    Lung cancer Mother 1   Breast cancer Paternal Aunt    Breast cancer Maternal Grandmother 82   Breast cancer Paternal Grandmother 43   Breast cancer Paternal Aunt    Breast cancer Maternal Aunt    Breast cancer Maternal Aunt      Review of Systems  Constitutional: Negative.     Exam:   BP (!) 111/50 (BP Location: Left Arm, Patient Position: Sitting, Cuff Size: Large)   Pulse (!) 55   Ht '5\' 4"'$  (1.626 m)   Wt 147 lb 3.2 oz (66.8 kg)   BMI 25.27 kg/m   Height: '5\' 4"'$  (162.6 cm)  General appearance: alert, cooperative and appears stated age Breasts: normal appearance, no masses or tenderness Abdomen: soft, non-tender; bowel sounds normal; no masses,  no organomegaly Lymph nodes: Cervical, supraclavicular, and axillary nodes normal.  No abnormal inguinal nodes palpated Neurologic: Grossly normal  Pelvic: External genitalia:  no lesions              Urethra:  normal appearing urethra with no masses, tenderness or lesions              Bartholins and Skenes: normal                 Vagina: normal appearing vagina with atrophic changes and no discharge, no lesions              Cervix: no lesions              Pap taken: No. Bimanual Exam:  Uterus:  normal size, contour, position, consistency,  mobility, non-tender              Adnexa: normal adnexa and no mass, fullness, tenderness               Rectovaginal: Confirms               Anus:  normal sphincter tone, no lesions  Chaperone, Octaviano Batty, CMA, was present for exam.  Assessment/Plan: 1. Encntr for gyn exam (general) (routine) w/o abn findings - Pap smear guidelines reviewed.  Will stop doing screening pap smears - Mammogram up to date.  Will call to get copy of this. - Colonoscopy due this year.  Referral placed.  - Bone mineral density done with Dr. Joylene Draft.  She will follow up with him about this - lab work done with PCP - vaccines reviewed/updated  2. Colon cancer screening - Ambulatory referral to Gastroenterology  3. History of UTI - nitrofurantoin, macrocrystal-monohydrate, (MACROBID) 100 MG capsule; Take 1 capsule (100 mg total) by mouth 2 (two) times daily.  Dispense: 10 capsule; Refill: 0 - estradiol (ESTRACE) 0.1 MG/GM vaginal cream;  Apply a small amount around the urethra twice weekly  Dispense: 42.5 g; Refill: 1  4. Family history of breast cancer - screening breast MRI has been dicussed.  Pt declines.

## 2022-08-09 DIAGNOSIS — Z1212 Encounter for screening for malignant neoplasm of rectum: Secondary | ICD-10-CM | POA: Diagnosis not present

## 2022-08-09 DIAGNOSIS — R002 Palpitations: Secondary | ICD-10-CM | POA: Diagnosis not present

## 2022-08-09 DIAGNOSIS — R7989 Other specified abnormal findings of blood chemistry: Secondary | ICD-10-CM | POA: Diagnosis not present

## 2022-08-09 DIAGNOSIS — E785 Hyperlipidemia, unspecified: Secondary | ICD-10-CM | POA: Diagnosis not present

## 2022-08-16 DIAGNOSIS — R82998 Other abnormal findings in urine: Secondary | ICD-10-CM | POA: Diagnosis not present

## 2022-08-29 ENCOUNTER — Encounter: Payer: Self-pay | Admitting: Gastroenterology

## 2022-09-18 DIAGNOSIS — Z1211 Encounter for screening for malignant neoplasm of colon: Secondary | ICD-10-CM | POA: Diagnosis not present

## 2022-10-16 DIAGNOSIS — H2513 Age-related nuclear cataract, bilateral: Secondary | ICD-10-CM | POA: Diagnosis not present

## 2022-10-16 DIAGNOSIS — H25013 Cortical age-related cataract, bilateral: Secondary | ICD-10-CM | POA: Diagnosis not present

## 2022-11-15 ENCOUNTER — Encounter (HOSPITAL_BASED_OUTPATIENT_CLINIC_OR_DEPARTMENT_OTHER): Payer: Self-pay | Admitting: *Deleted

## 2022-11-27 DIAGNOSIS — D72819 Decreased white blood cell count, unspecified: Secondary | ICD-10-CM | POA: Diagnosis not present

## 2022-11-27 DIAGNOSIS — Z780 Asymptomatic menopausal state: Secondary | ICD-10-CM | POA: Diagnosis not present

## 2022-11-27 DIAGNOSIS — E785 Hyperlipidemia, unspecified: Secondary | ICD-10-CM | POA: Diagnosis not present

## 2022-12-12 DIAGNOSIS — Z1231 Encounter for screening mammogram for malignant neoplasm of breast: Secondary | ICD-10-CM | POA: Diagnosis not present

## 2022-12-17 ENCOUNTER — Encounter (HOSPITAL_BASED_OUTPATIENT_CLINIC_OR_DEPARTMENT_OTHER): Payer: Self-pay | Admitting: *Deleted

## 2022-12-27 DIAGNOSIS — H18413 Arcus senilis, bilateral: Secondary | ICD-10-CM | POA: Diagnosis not present

## 2022-12-27 DIAGNOSIS — H2513 Age-related nuclear cataract, bilateral: Secondary | ICD-10-CM | POA: Diagnosis not present

## 2022-12-27 DIAGNOSIS — H25013 Cortical age-related cataract, bilateral: Secondary | ICD-10-CM | POA: Diagnosis not present

## 2022-12-27 DIAGNOSIS — H2512 Age-related nuclear cataract, left eye: Secondary | ICD-10-CM | POA: Diagnosis not present

## 2022-12-27 DIAGNOSIS — H25043 Posterior subcapsular polar age-related cataract, bilateral: Secondary | ICD-10-CM | POA: Diagnosis not present

## 2023-05-13 DIAGNOSIS — R7401 Elevation of levels of liver transaminase levels: Secondary | ICD-10-CM | POA: Diagnosis not present

## 2023-05-17 IMAGING — CT CT CARDIAC CORONARY ARTERY CALCIUM SCORE
3 series · 13 of 20 positions shown, 15 images · non-contrast
Comparison: None.

CLINICAL DATA: 70-year-old Caucasian female with history of
hyperlipidemia and family history of heart disease.

EXAM:
CT CARDIAC CORONARY ARTERY CALCIUM SCORE
TECHNIQUE: Non-contrast imaging through the heart was performed using
prospective ECG gating. Image post processing was performed on an
independent workstation, allowing for quantitative analysis of the
heart and coronary arteries. Note that this exam targets the heart
and the chest was not imaged in its entirety.

[Series 2: calcium scoring 2.00 qr36 bestdiast 70% hrt calciu · axial · 0.39mm/px · z∈[+1551,+1615]mm · 3 of 80 slices shown]
[im 16/80  vessel]
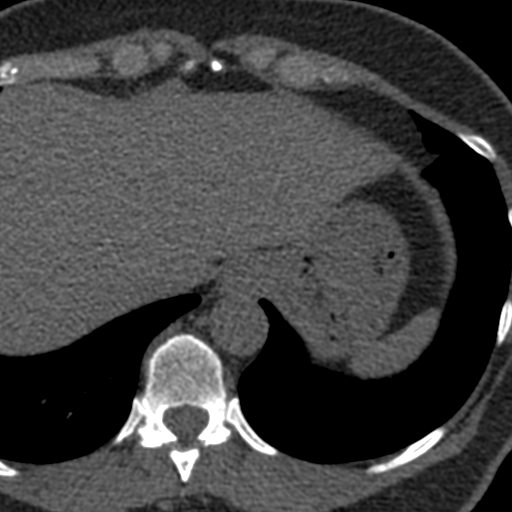
[im 32/80  vessel]
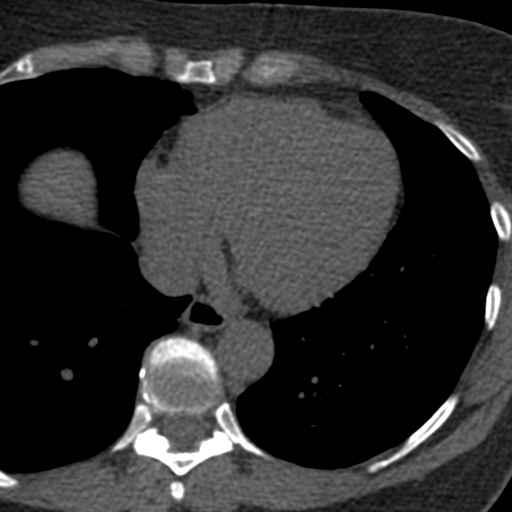
[im 48/80  vessel]
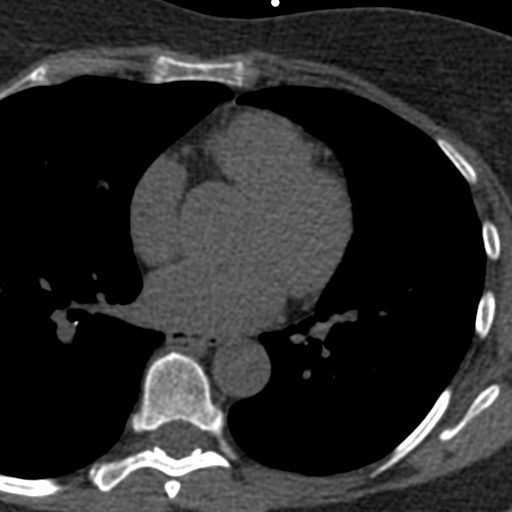

[Series 3: calcium scoring 2.00 br40 bestdiast 70% axial · axial · 0.51mm/px · z∈[+1547,+1651]mm · 5 of 80 slices shown, 7 images]
[im 14/80  vessel]
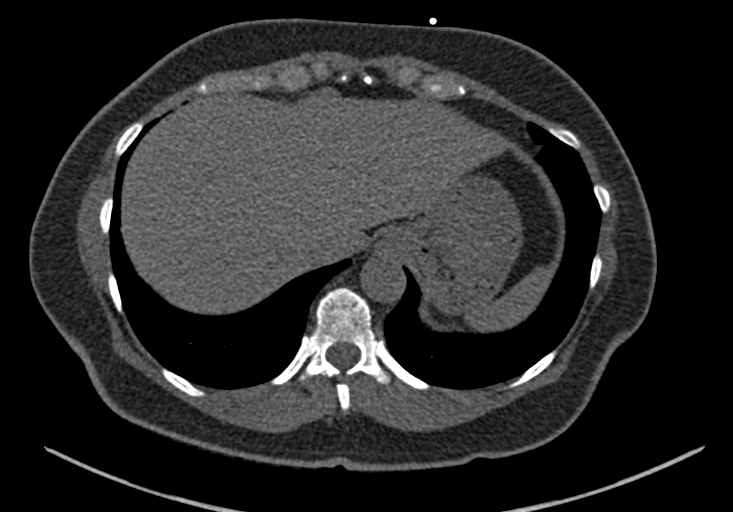
[im 14/80  lung]
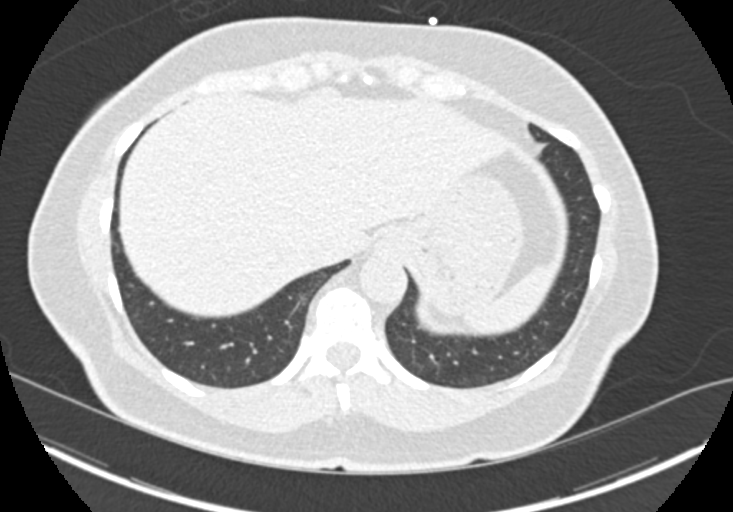
[im 27/80  vessel]
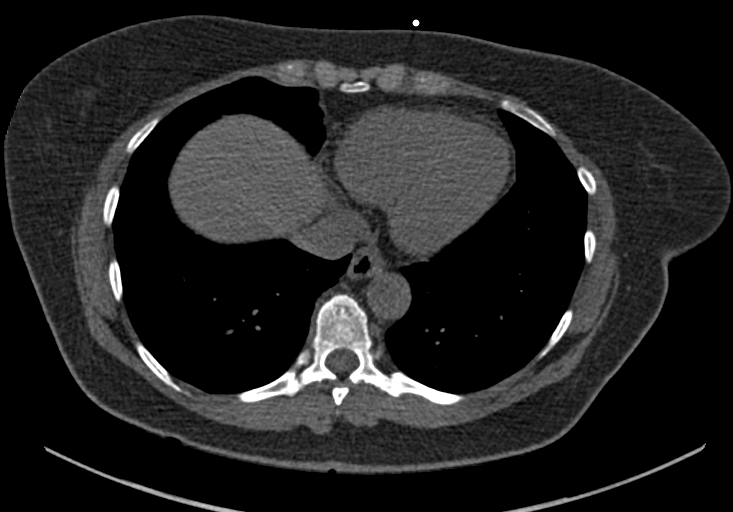
[im 40/80  vessel]
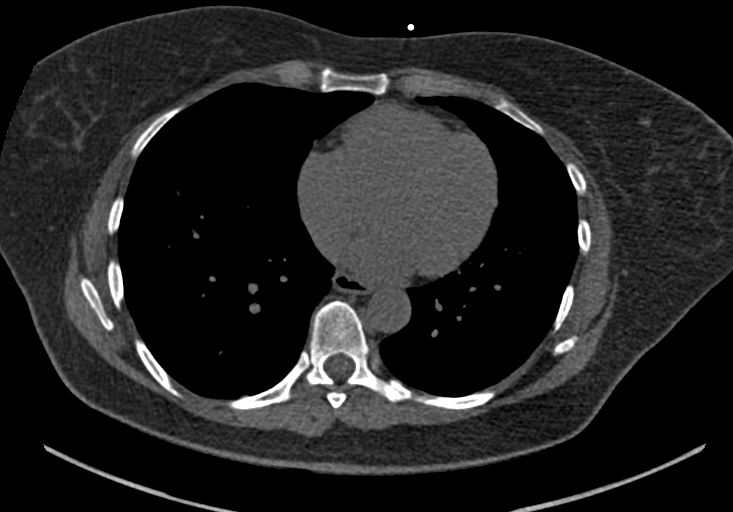
[im 53/80  vessel]
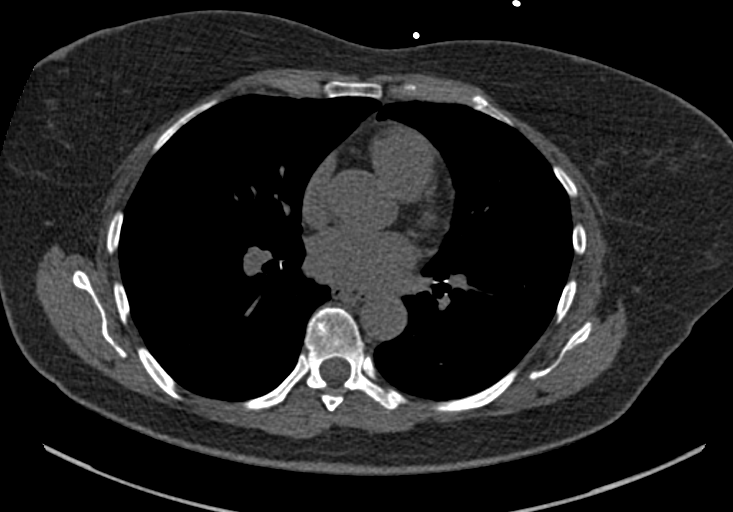
[im 66/80  vessel]
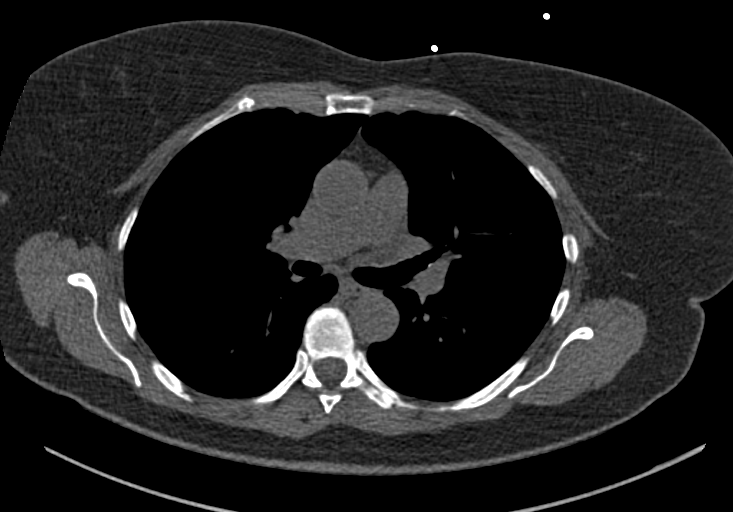
[im 66/80  lung]
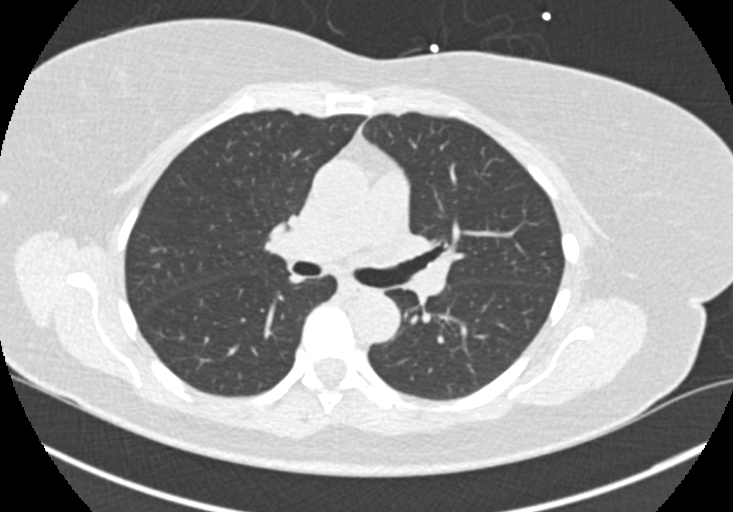

[Series 9: calcium scoring 2.00 br60 bestdiast 70% lungs · axial · 0.48mm/px · z∈[+1547,+1651]mm · 5 of 80 slices shown]
[im 14/80  vessel]
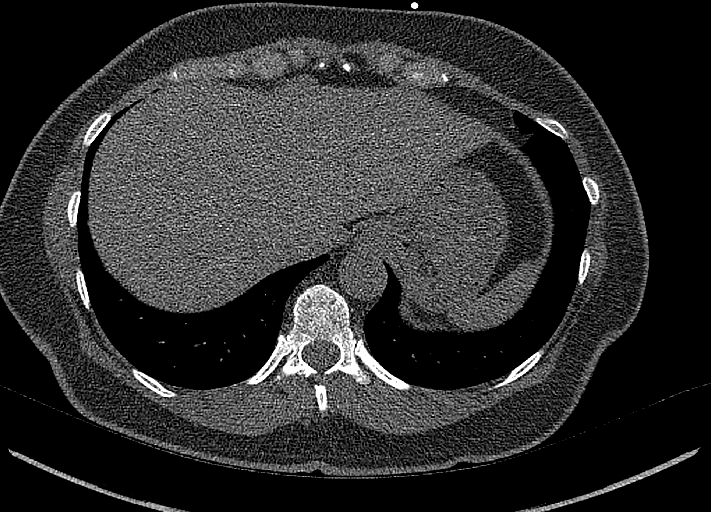
[im 27/80  vessel]
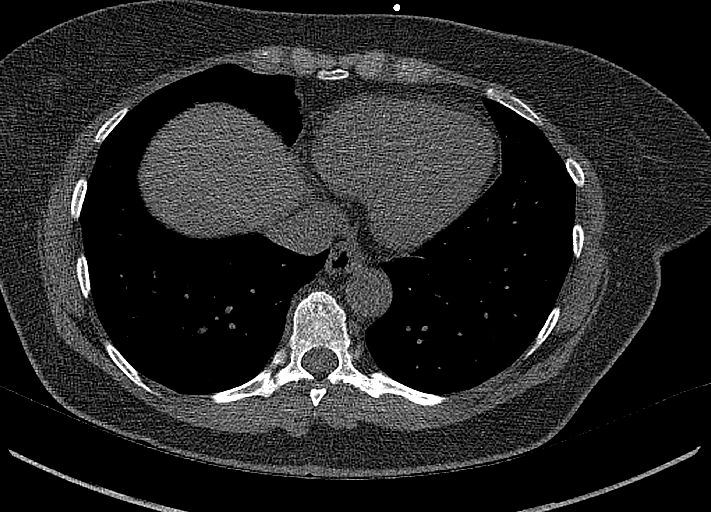
[im 40/80  vessel]
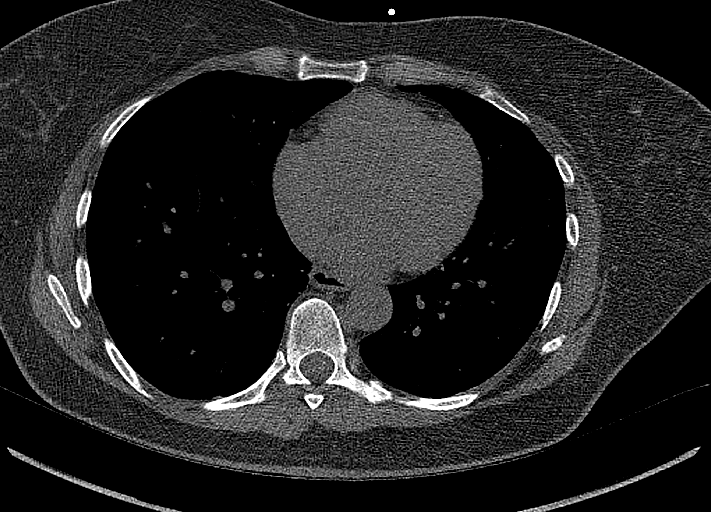
[im 53/80  vessel]
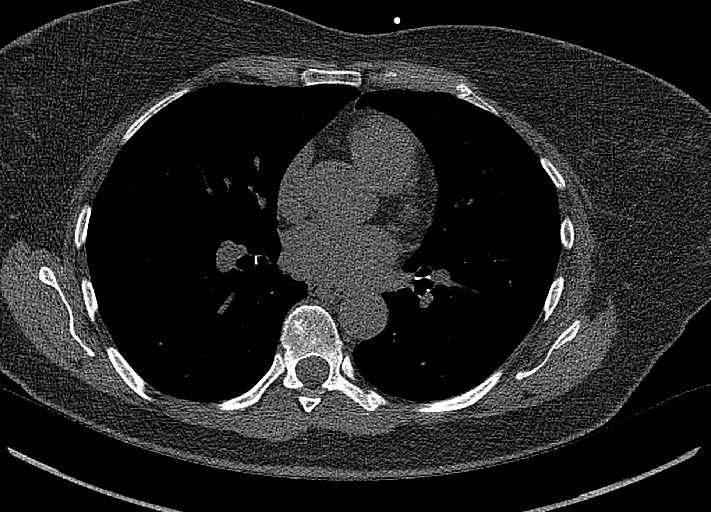
[im 66/80  vessel]
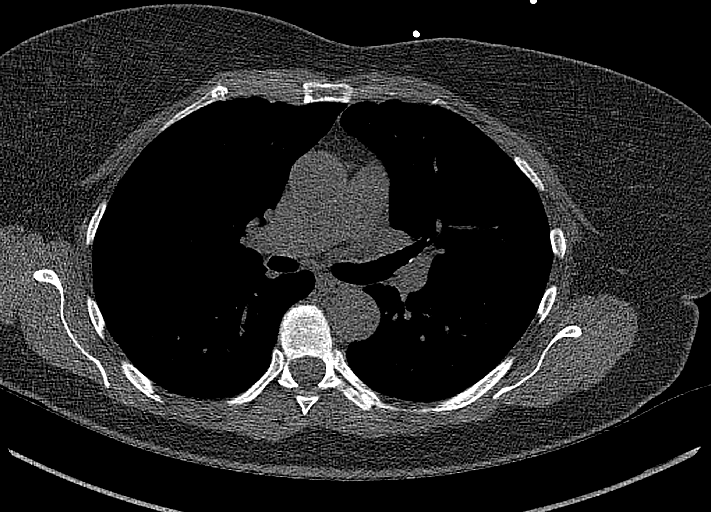

[13 of 20 positions shown; findings below may reference images not displayed]

FINDINGS: CORONARY CALCIUM SCORES:

Left Main: 0

LAD: 0

LCx: 0

RCA: 0

Total Agatston Score: 0

[HOSPITAL] percentile: 0

AORTA MEASUREMENTS:

Ascending Aorta: 27 mm

Descending Aorta: 23 mm

OTHER FINDINGS:


## 2023-06-12 DIAGNOSIS — R7401 Elevation of levels of liver transaminase levels: Secondary | ICD-10-CM | POA: Diagnosis not present

## 2023-08-26 DIAGNOSIS — H52209 Unspecified astigmatism, unspecified eye: Secondary | ICD-10-CM | POA: Diagnosis not present

## 2023-08-26 DIAGNOSIS — H2511 Age-related nuclear cataract, right eye: Secondary | ICD-10-CM | POA: Diagnosis not present

## 2023-08-27 DIAGNOSIS — H2512 Age-related nuclear cataract, left eye: Secondary | ICD-10-CM | POA: Diagnosis not present

## 2023-09-09 DIAGNOSIS — H2512 Age-related nuclear cataract, left eye: Secondary | ICD-10-CM | POA: Diagnosis not present

## 2023-10-22 ENCOUNTER — Encounter (HOSPITAL_BASED_OUTPATIENT_CLINIC_OR_DEPARTMENT_OTHER): Payer: Self-pay | Admitting: Obstetrics & Gynecology

## 2023-10-22 ENCOUNTER — Other Ambulatory Visit (HOSPITAL_COMMUNITY)
Admission: RE | Admit: 2023-10-22 | Discharge: 2023-10-22 | Disposition: A | Discharge status: Home / Self Care | Source: Ambulatory Visit | Attending: Obstetrics & Gynecology | Admitting: Obstetrics & Gynecology

## 2023-10-22 ENCOUNTER — Ambulatory Visit (HOSPITAL_BASED_OUTPATIENT_CLINIC_OR_DEPARTMENT_OTHER): Admitting: Obstetrics & Gynecology

## 2023-10-22 VITALS — BP 132/61 | HR 55 | Ht 64.0 in | Wt 152.4 lb

## 2023-10-22 DIAGNOSIS — R1031 Right lower quadrant pain: Secondary | ICD-10-CM | POA: Diagnosis not present

## 2023-10-22 DIAGNOSIS — N898 Other specified noninflammatory disorders of vagina: Secondary | ICD-10-CM | POA: Diagnosis not present

## 2023-10-22 NOTE — Progress Notes (Signed)
 GYNECOLOGY  VISIT  CC:   concerns about lower right sided pain and vaginal discharge  HPI: 73 y.o. G2P0 Married White or Caucasian female here for concerns about a vaginal dsicharge that has been present for about six months.  Does not have any vaginal odor or vaginal bleeding.  Denies new urinary issues.  She does have some right lower back pain that sometimes has an associated right lower quadrant pain.  She does have some concerns about possible issues with ovary on the right side due to symptoms.   Past Medical History:  Diagnosis Date   Hemorrhoids    Hyperlipemia    Thyroid  disorder    mildly elevated TSH    MEDS:   Current Outpatient Medications on File Prior to Visit  Medication Sig Dispense Refill   atorvastatin  (LIPITOR) 20 MG tablet Take 1 tablet (20 mg total) by mouth daily. 30 tablet 3   estradiol  (ESTRACE ) 0.1 MG/GM vaginal cream Apply a small amount around the urethra twice weekly (Patient not taking: Reported on 10/22/2023) 42.5 g 1   nitrofurantoin , macrocrystal-monohydrate, (MACROBID ) 100 MG capsule Take 1 capsule (100 mg total) by mouth 2 (two) times daily. (Patient not taking: Reported on 10/22/2023) 10 capsule 0   No current facility-administered medications on file prior to visit.    ALLERGIES: Penicillin g  SH:  married, non smoker  Review of Systems  Constitutional: Negative.   Genitourinary: Negative.     PHYSICAL EXAMINATION:    BP 132/61 (BP Location: Left Arm, Patient Position: Sitting, Cuff Size: Normal)   Pulse (!) 55   Ht 5\' 4"  (1.626 m)   Wt 152 lb 6.4 oz (69.1 kg)   BMI 26.16 kg/m     General appearance: alert, cooperative and appears stated age   Lymph:  no inguinal LAD noted  Pelvic: External genitalia:  no lesions              Urethra:  normal appearing urethra with no masses, tenderness or lesions              Bartholins and Skenes: normal                 Vagina:  Normal mucosa, discharge noted, no odor              Cervix: no  lesions              Bimanual Exam:  Uterus:  normal size, contour, position, consistency, mobility, non-tender              Adnexa: no mass, fullness, tenderness              Anus:  normal sphincter tone, no lesions  Chaperone, Myrtie Atkinson, CMA, was present for exam.  Assessment/Plan: 1. Right lower quadrant pain (Primary) - Exam is normal today but given right lower quadrant pain and postmenopausal status, feel ultrasound is reasonable.  She will return for this. - US  PELVIS TRANSVAGINAL NON-OB (TV ONLY); Future  2. Vaginal discharge -Vaginitis swab obtained - Cervicovaginal ancillary only( [place])

## 2023-10-23 LAB — CERVICOVAGINAL ANCILLARY ONLY
Bacterial Vaginitis (gardnerella): POSITIVE — AB
Candida Glabrata: NEGATIVE
Candida Vaginitis: NEGATIVE
Comment: NEGATIVE
Comment: NEGATIVE
Comment: NEGATIVE

## 2023-10-25 MED ORDER — METRONIDAZOLE 0.75 % VA GEL: 1.0000 | Freq: Every day | VAGINAL | 0 refills | Status: AC

## 2023-11-06 ENCOUNTER — Ambulatory Visit (HOSPITAL_BASED_OUTPATIENT_CLINIC_OR_DEPARTMENT_OTHER)

## 2023-11-06 ENCOUNTER — Ambulatory Visit (HOSPITAL_BASED_OUTPATIENT_CLINIC_OR_DEPARTMENT_OTHER): Admitting: Obstetrics & Gynecology

## 2023-11-06 VITALS — BP 132/76 | HR 54 | Ht 64.0 in | Wt 152.0 lb

## 2023-11-06 DIAGNOSIS — M25562 Pain in left knee: Secondary | ICD-10-CM | POA: Diagnosis not present

## 2023-11-06 DIAGNOSIS — R1031 Right lower quadrant pain: Secondary | ICD-10-CM

## 2023-11-06 DIAGNOSIS — R935 Abnormal findings on diagnostic imaging of other abdominal regions, including retroperitoneum: Secondary | ICD-10-CM

## 2023-11-06 DIAGNOSIS — M1712 Unilateral primary osteoarthritis, left knee: Secondary | ICD-10-CM | POA: Diagnosis not present

## 2023-11-06 NOTE — Progress Notes (Signed)
 GYNECOLOGY  VISIT  CC:   Discuss ultrasound results, h/o RLQ pain/discomfort  HPI: 73 y.o. G2P0 Married White or Caucasian female here for discussion of ultrasound results.  Ultrasound obtained due to RLQ pain/discomfort.  Findings showed uterus measuring 4.5 x 3.4 x 2.0cm.  Endometrium 5.68mm, irregular with cystic spaces.  No abnormal vascularity.  Right ovary 2 x 1 x 1 cm without any abnormal findings.  Given abnormal endometrium, hysteroscopy with possible polyp resection, D&C discussed.    Procedure discussed with patient.  Recovery and pain management discussed.  Risks discussed including but not limited to bleeding, rare risk of transfusion, infection, 1% risk of uterine perforation with risks of fluid deficit causing cardiac arrythmia, cerebral swelling and/or need to stop procedure early.  Fluid emboli and rare risk of death discussed.  DVT/PE, rare risk of risk of bowel/bladder/ureteral/vascular injury.  Patient aware if pathology abnormal she may need additional treatment.  All questions answered.  Will proceed with surgical scheduling.   Past Medical History:  Diagnosis Date   Hemorrhoids    Hyperlipemia    Thyroid  disorder    mildly elevated TSH    MEDS:   Current Outpatient Medications on File Prior to Visit  Medication Sig Dispense Refill   atorvastatin  (LIPITOR) 20 MG tablet Take 1 tablet (20 mg total) by mouth daily. 30 tablet 3   estradiol  (ESTRACE ) 0.1 MG/GM vaginal cream Apply a small amount around the urethra twice weekly (Patient not taking: Reported on 10/22/2023) 42.5 g 1   metroNIDAZOLE  (METROGEL ) 0.75 % vaginal gel Place 1 Applicatorful vaginally at bedtime. Use for 5 nights. 70 g 0   nitrofurantoin , macrocrystal-monohydrate, (MACROBID ) 100 MG capsule Take 1 capsule (100 mg total) by mouth 2 (two) times daily. (Patient not taking: Reported on 10/22/2023) 10 capsule 0   No current facility-administered medications on file prior to visit.    ALLERGIES: Penicillin  g  SH:  married, non smoker  Review of Systems  Constitutional: Negative.   Gastrointestinal:  Positive for abdominal pain (RLQ).  Genitourinary: Negative.     PHYSICAL EXAMINATION:    BP 132/76 (Cuff Size: Normal)   Pulse (!) 54   Ht 5\' 4"  (1.626 m)   Wt 152 lb (68.9 kg)   BMI 26.09 kg/m      Physical Exam Constitutional:      Appearance: Normal appearance.  Neurological:     General: No focal deficit present.     Mental Status: She is alert.  Psychiatric:        Mood and Affect: Mood normal.     Assessment/Plan: 1. RLQ abdominal pain (Primary) - findings reviewed.  No clear cause for her symptoms noted on exam today but possibly endometrial abnormality could be contributing.  2. Abnormal ultrasound of endometrium - Ambulatory Referral For Surgery Scheduling

## 2023-11-09 ENCOUNTER — Encounter (HOSPITAL_BASED_OUTPATIENT_CLINIC_OR_DEPARTMENT_OTHER): Payer: Self-pay | Admitting: Obstetrics & Gynecology

## 2023-11-12 ENCOUNTER — Telehealth: Payer: Self-pay

## 2023-11-12 DIAGNOSIS — N390 Urinary tract infection, site not specified: Secondary | ICD-10-CM | POA: Diagnosis not present

## 2023-11-12 DIAGNOSIS — D72819 Decreased white blood cell count, unspecified: Secondary | ICD-10-CM | POA: Diagnosis not present

## 2023-11-12 DIAGNOSIS — R399 Unspecified symptoms and signs involving the genitourinary system: Secondary | ICD-10-CM | POA: Diagnosis not present

## 2023-11-12 DIAGNOSIS — E785 Hyperlipidemia, unspecified: Secondary | ICD-10-CM | POA: Diagnosis not present

## 2023-11-12 DIAGNOSIS — Z1212 Encounter for screening for malignant neoplasm of rectum: Secondary | ICD-10-CM | POA: Diagnosis not present

## 2023-11-12 DIAGNOSIS — E039 Hypothyroidism, unspecified: Secondary | ICD-10-CM | POA: Diagnosis not present

## 2023-11-12 NOTE — Telephone Encounter (Signed)
 I called patient to schedule surgery w/ Dr. Annabell Key on 12/16/23 at 3:00 pm at [facility]. Patient is available and agreed to be scheduled. I provided patient with pre-op instructions and surgery details. Written confirmation will mailed to the patient.

## 2023-11-19 DIAGNOSIS — R011 Cardiac murmur, unspecified: Secondary | ICD-10-CM | POA: Diagnosis not present

## 2023-11-19 DIAGNOSIS — Z0000 Encounter for general adult medical examination without abnormal findings: Secondary | ICD-10-CM | POA: Diagnosis not present

## 2023-11-19 DIAGNOSIS — R000 Tachycardia, unspecified: Secondary | ICD-10-CM | POA: Diagnosis not present

## 2023-11-19 DIAGNOSIS — E039 Hypothyroidism, unspecified: Secondary | ICD-10-CM | POA: Diagnosis not present

## 2023-11-19 DIAGNOSIS — R002 Palpitations: Secondary | ICD-10-CM | POA: Diagnosis not present

## 2023-11-19 DIAGNOSIS — E785 Hyperlipidemia, unspecified: Secondary | ICD-10-CM | POA: Diagnosis not present

## 2023-11-19 DIAGNOSIS — G4700 Insomnia, unspecified: Secondary | ICD-10-CM | POA: Diagnosis not present

## 2023-11-19 DIAGNOSIS — R7401 Elevation of levels of liver transaminase levels: Secondary | ICD-10-CM | POA: Diagnosis not present

## 2023-11-19 DIAGNOSIS — Z1331 Encounter for screening for depression: Secondary | ICD-10-CM | POA: Diagnosis not present

## 2023-12-11 ENCOUNTER — Encounter (HOSPITAL_COMMUNITY): Payer: Self-pay | Admitting: Obstetrics & Gynecology

## 2023-12-11 ENCOUNTER — Other Ambulatory Visit: Payer: Self-pay

## 2023-12-11 NOTE — Progress Notes (Addendum)
 SDW CALL  Patient was given pre-op instructions over the phone. The opportunity was given for the patient to ask questions. No further questions asked. Patient verbalized understanding of instructions given.   PCP - Aldo Hun, MD  Cardiologist -   PPM/ICD - denies Device Orders - n/a Rep Notified - n/a  Chest x-ray -  EKG -  Stress Test - 2007 ECHO - 03-18-2017 Cardiac Cath -   Sleep Study - denies CPAP - n/a  Dm -denies  Blood Thinner Instructions:denies Aspirin Instructions:n/a  ERAS Protcol - Clear liquids until 12:30  COVID TEST- n/a  Anesthesia review: no   Per patient in beginning of May she experienced increase in heart rate around 150, she feels it was related to eating a lot of sugar that day episode lasted maybe 1 hour.  PCP is aware and requested to monitor heart rate until next follow.   Per patient she also had a one time dose of doxycycline for a tick bite 2 weeks ago. Patient denies shortness of breath, fever, cough and chest pain over the phone call   All instructions explained to the patient, with a verbal understanding of the material. Patient agrees to go over the instructions while at home for a better understanding.

## 2023-12-12 ENCOUNTER — Other Ambulatory Visit (HOSPITAL_BASED_OUTPATIENT_CLINIC_OR_DEPARTMENT_OTHER): Payer: Self-pay | Admitting: Obstetrics & Gynecology

## 2023-12-12 DIAGNOSIS — Z01818 Encounter for other preprocedural examination: Secondary | ICD-10-CM

## 2023-12-16 ENCOUNTER — Other Ambulatory Visit (HOSPITAL_COMMUNITY): Payer: Self-pay

## 2023-12-16 ENCOUNTER — Ambulatory Visit (HOSPITAL_COMMUNITY): Admitting: Anesthesiology

## 2023-12-16 ENCOUNTER — Ambulatory Visit (HOSPITAL_COMMUNITY)
Admission: RE | Admit: 2023-12-16 | Discharge: 2023-12-16 | Disposition: A | Discharge status: Home / Self Care | Attending: Obstetrics & Gynecology | Admitting: Obstetrics & Gynecology

## 2023-12-16 ENCOUNTER — Other Ambulatory Visit (HOSPITAL_BASED_OUTPATIENT_CLINIC_OR_DEPARTMENT_OTHER): Payer: Self-pay | Admitting: Obstetrics & Gynecology

## 2023-12-16 ENCOUNTER — Encounter (HOSPITAL_COMMUNITY)
Admission: RE | Disposition: A | Discharge status: Home / Self Care | Payer: Self-pay | Source: Home / Self Care | Attending: Obstetrics & Gynecology

## 2023-12-16 ENCOUNTER — Other Ambulatory Visit: Payer: Self-pay

## 2023-12-16 ENCOUNTER — Encounter (HOSPITAL_COMMUNITY): Payer: Self-pay | Admitting: Obstetrics & Gynecology

## 2023-12-16 DIAGNOSIS — N806 Endometriosis in cutaneous scar: Secondary | ICD-10-CM | POA: Diagnosis not present

## 2023-12-16 DIAGNOSIS — G8918 Other acute postprocedural pain: Secondary | ICD-10-CM

## 2023-12-16 DIAGNOSIS — N840 Polyp of corpus uteri: Secondary | ICD-10-CM | POA: Diagnosis not present

## 2023-12-16 DIAGNOSIS — R9389 Abnormal findings on diagnostic imaging of other specified body structures: Secondary | ICD-10-CM | POA: Diagnosis not present

## 2023-12-16 DIAGNOSIS — R935 Abnormal findings on diagnostic imaging of other abdominal regions, including retroperitoneum: Secondary | ICD-10-CM | POA: Diagnosis not present

## 2023-12-16 DIAGNOSIS — E785 Hyperlipidemia, unspecified: Secondary | ICD-10-CM

## 2023-12-16 DIAGNOSIS — Z01818 Encounter for other preprocedural examination: Secondary | ICD-10-CM

## 2023-12-16 HISTORY — PX: HYSTEROSCOPY WITH D & C: SHX1775

## 2023-12-16 LAB — CBC
HCT: 43.5 % (ref 36.0–46.0)
Hemoglobin: 13.7 g/dL (ref 12.0–15.0)
MCH: 32.6 pg (ref 26.0–34.0)
MCHC: 31.5 g/dL (ref 30.0–36.0)
MCV: 103.6 fL — ABNORMAL HIGH (ref 80.0–100.0)
Platelets: 230 10*3/uL (ref 150–400)
RBC: 4.2 MIL/uL (ref 3.87–5.11)
RDW: 12.7 % (ref 11.5–15.5)
WBC: 6.7 10*3/uL (ref 4.0–10.5)
nRBC: 0 % (ref 0.0–0.2)

## 2023-12-16 SURGERY — DILATATION AND CURETTAGE /HYSTEROSCOPY
Anesthesia: General | Site: Vagina

## 2023-12-16 MED ORDER — LACTATED RINGERS IV SOLN: INTRAVENOUS | Status: DC | PRN

## 2023-12-16 MED ORDER — EPHEDRINE SULFATE-NACL 50-0.9 MG/10ML-% IV SOSY
PREFILLED_SYRINGE | INTRAVENOUS | Status: DC | PRN
  Administered 2023-12-16 (×2): 5 mg via INTRAVENOUS

## 2023-12-16 MED ORDER — CHLORHEXIDINE GLUCONATE 0.12 % MT SOLN
15.0000 mL | OROMUCOSAL | Status: AC
  Administered 2023-12-16: 15 mL via OROMUCOSAL
  Filled 2023-12-16 (×2): qty 15

## 2023-12-16 MED ORDER — LIDOCAINE-EPINEPHRINE 1 %-1:100000 IJ SOLN
INTRAMUSCULAR | Status: DC | PRN
  Administered 2023-12-16: 10 mL

## 2023-12-16 MED ORDER — DEXAMETHASONE SODIUM PHOSPHATE 10 MG/ML IJ SOLN
INTRAMUSCULAR | Status: DC | PRN
  Administered 2023-12-16: 10 mg via INTRAVENOUS

## 2023-12-16 MED ORDER — ONDANSETRON HCL 4 MG/2ML IJ SOLN
INTRAMUSCULAR | Status: DC | PRN
Start: 2023-12-16 — End: 2023-12-16
  Administered 2023-12-16: 4 mg via INTRAVENOUS

## 2023-12-16 MED ORDER — ACETAMINOPHEN 500 MG PO TABS
1000.0000 mg | ORAL_TABLET | ORAL | Status: AC
  Administered 2023-12-16: 1000 mg via ORAL
  Filled 2023-12-16: qty 2

## 2023-12-16 MED ORDER — DEXAMETHASONE SODIUM PHOSPHATE 10 MG/ML IJ SOLN
INTRAMUSCULAR | Status: AC
Start: 2023-12-16 — End: 2023-12-16
  Filled 2023-12-16: qty 1

## 2023-12-16 MED ORDER — PROPOFOL 10 MG/ML IV BOLUS
INTRAVENOUS | Status: AC
  Filled 2023-12-16: qty 20

## 2023-12-16 MED ORDER — LACTATED RINGERS IV SOLN: INTRAVENOUS | Status: DC

## 2023-12-16 MED ORDER — POVIDONE-IODINE 10 % EX SWAB: 2.0000 | Freq: Once | CUTANEOUS | Status: DC

## 2023-12-16 MED ORDER — LIDOCAINE 2% (20 MG/ML) 5 ML SYRINGE
INTRAMUSCULAR | Status: AC
  Filled 2023-12-16: qty 5

## 2023-12-16 MED ORDER — DEXMEDETOMIDINE HCL IN NACL 80 MCG/20ML IV SOLN
INTRAVENOUS | Status: DC | PRN
  Administered 2023-12-16: 20 ug via INTRAVENOUS

## 2023-12-16 MED ORDER — HYDROCODONE-ACETAMINOPHEN 5-325 MG PO TABS
1.0000 | ORAL_TABLET | Freq: Four times a day (QID) | ORAL | 0 refills | Status: AC | PRN
  Filled 2023-12-16: qty 12, 2d supply, fill #0

## 2023-12-16 MED ORDER — IBUPROFEN 600 MG PO TABS
600.0000 mg | ORAL_TABLET | Freq: Four times a day (QID) | ORAL | 0 refills | Status: AC | PRN
  Filled 2023-12-16: qty 15, 4d supply, fill #0

## 2023-12-16 MED ORDER — SODIUM CHLORIDE 0.9 % IR SOLN
Status: DC | PRN
  Administered 2023-12-16: 3000 mL

## 2023-12-16 MED ORDER — PROPOFOL 10 MG/ML IV BOLUS
INTRAVENOUS | Status: DC | PRN
  Administered 2023-12-16: 150 mg via INTRAVENOUS

## 2023-12-16 MED ORDER — LIDOCAINE HCL (CARDIAC) PF 100 MG/5ML IV SOSY
PREFILLED_SYRINGE | INTRAVENOUS | Status: DC | PRN
  Administered 2023-12-16: 60 mg via INTRAVENOUS

## 2023-12-16 MED ORDER — EPHEDRINE 5 MG/ML INJ
INTRAVENOUS | Status: AC
  Filled 2023-12-16: qty 5

## 2023-12-16 MED ORDER — ONDANSETRON HCL 4 MG/2ML IJ SOLN
INTRAMUSCULAR | Status: AC
  Filled 2023-12-16: qty 2

## 2023-12-16 SURGICAL SUPPLY — 16 items
CANISTER SUCTION 3000ML PPV (SUCTIONS) ×1 IMPLANT
CATH ROBINSON RED A/P 16FR (CATHETERS) ×1 IMPLANT
COVER MAYO STAND STRL (DRAPES) ×1 IMPLANT
DEVICE MYOSURE LITE (MISCELLANEOUS) IMPLANT
DEVICE MYOSURE REACH (MISCELLANEOUS) IMPLANT
DILATOR CANAL MILEX (MISCELLANEOUS) IMPLANT
GLOVE ECLIPSE 6.5 STRL STRAW (GLOVE) ×1 IMPLANT
GLOVE SURG UNDER POLY LF SZ7 (GLOVE) ×2 IMPLANT
GOWN STRL REUS W/ TWL LRG LVL3 (GOWN DISPOSABLE) ×2 IMPLANT
KIT PROCEDURE FLUENT (KITS) ×1 IMPLANT
KIT TURNOVER KIT B (KITS) ×1 IMPLANT
PACK VAGINAL MINOR WOMEN LF (CUSTOM PROCEDURE TRAY) ×1 IMPLANT
PAD OB MATERNITY 11 LF (PERSONAL CARE ITEMS) ×1 IMPLANT
SEAL ROD LENS SCOPE MYOSURE (ABLATOR) ×1 IMPLANT
TOWEL GREEN STERILE FF (TOWEL DISPOSABLE) ×2 IMPLANT
UNDERPAD 30X36 HEAVY ABSORB (UNDERPADS AND DIAPERS) ×1 IMPLANT

## 2023-12-16 NOTE — Anesthesia Procedure Notes (Signed)
 Procedure Name: LMA Insertion Date/Time: 12/16/2023 4:11 PM  Performed by: Alisia Apple, CRNAPre-anesthesia Checklist: Patient identified, Emergency Drugs available, Suction available and Patient being monitored Patient Re-evaluated:Patient Re-evaluated prior to induction Oxygen Delivery Method: Circle system utilized Preoxygenation: Pre-oxygenation with 100% oxygen Induction Type: IV induction LMA: LMA inserted LMA Size: 3.0 Number of attempts: 1 Placement Confirmation: positive ETCO2 Tube secured with: Tape Dental Injury: Teeth and Oropharynx as per pre-operative assessment  Comments: Inserted by c. Era Hasty, Scientist, clinical (histocompatibility and immunogenetics);

## 2023-12-16 NOTE — Op Note (Signed)
 12/16/2023  4:50 PM  PATIENT:  Teresa Wallace  73 y.o. female  PRE-OPERATIVE DIAGNOSIS:  Abnormal endometrium ultrasound Polyp  POST-OPERATIVE DIAGNOSIS:  Abnormal endometrium ultrasound, endometrial scarring  PROCEDURE:  Procedure(s): DILATATION AND CURETTAGE /HYSTEROSCOPY  SURGEON:  Lillian Rein  ASSISTANTS: OR staff  ANESTHESIA:   general  ESTIMATED BLOOD LOSS: 10 mL  BLOOD ADMINISTERED:none   FLUIDS: 1000cc LR  UOP: 75cc clear UOP  SPECIMEN:  endometrial curettings  DISPOSITION OF SPECIMEN:  PATHOLOGY  FINDINGS: web-like endometrial tissue most c/w scarring, no polyp  DESCRIPTION OF OPERATION: Patient was taken to the operating room.  She is placed in the supine position. SCDs were on her lower extremities and functioning properly. General anesthesia with an LMA was administered without difficulty. Dr. Stolzfus, anesthesia, oversaw case.  Legs were then placed in the [facility] stirrups in the low lithotomy position. The legs were lifted to the high lithotomy position and the Betadine prep was used on the inner thighs perineum and vagina x3. Patient was draped in a normal standard fashion. An in and out catheterization with a red rubber Foley catheter was performed. Approximately 75 cc of clear urine was noted. A bivalve speculum was placed the vagina. The anterior lip of the cervix was grasped with single-tooth tenaculum.  A paracervical block of 1% lidocaine  mixed one-to-one with epinephrine (1:100,000 units).  10 cc was used total. The cervix is dilated up to #[facility] dilators. The endometrial cavity sounded to 6 cm.   A 5.5 millimeter diagnostic hysteroscope was obtained. Normal saline was used as a hysteroscopic fluid. The hysteroscope was advanced through the endocervical canal into the endometrial cavity. The tubal ostia were noted bilaterally.  There was web-like tissue inside the endometrium.  There was no polyp.  The hysteroscope was removed. A #1 toothed curette  was used to curette the cavity until rough gritty texture was noted in all quadrants. With revisualization of the hysteroscope, there was no longer any abnormal findings.  A second curetting was then performed.  This endometrial cavity was much more normal in appearance at this point.  No other procedure was needed and this procedure was ended. The hysteroscope was removed. The fluid deficit was 25 cc. The tenaculum was removed from the anterior lip of the cervix. The speculum was removed from the vagina. The prep was cleansed of the patient's skin. The legs are positioned back in the supine position. Sponge, lap, needle, instrument counts were correct x2. Patient was taken to recovery in stable condition.   COUNTS:  YES  PLAN OF CARE: Transfer to PACU

## 2023-12-16 NOTE — Transfer of Care (Signed)
 Immediate Anesthesia Transfer of Care Note  Patient: Teresa Wallace  Procedure(s) Performed: DILATATION AND CURETTAGE /HYSTEROSCOPY (Vagina )  Patient Location: PACU  Anesthesia Type:General  Level of Consciousness: awake, alert , oriented, and patient cooperative  Airway & Oxygen Therapy: Patient Spontanous Breathing  Post-op Assessment: Report given to RN and Post -op Vital signs reviewed and stable  Post vital signs: Reviewed stable  Last Vitals:  Vitals Value Taken Time  BP 125/56 12/16/23 1654  Temp 36.9 C 12/16/23 1654  Pulse 68 12/16/23 1654  Resp 15 12/16/23 1654  SpO2 98 % 12/16/23 1654  Vitals shown include unfiled device data.  Last Pain:  Vitals:   12/16/23 1650  TempSrc:   PainSc: 0-No pain      Patients Stated Pain Goal: 0 (12/16/23 1341)  Complications: No notable events documented.

## 2023-12-16 NOTE — Discharge Instructions (Signed)
Post-surgical Instructions, Outpatient Surgery ° °You may expect to feel dizzy, weak, and drowsy for as long as 24 hours after receiving the medicine that made you sleep (anesthetic). For the first 24 hours after your surgery:   °· Do not drive a car, ride a bicycle, participate in physical activities, or take public transportation until you are done taking narcotic pain medicines or as directed by Dr. Hisashi Amadon.  °· Do not drink alcohol or take tranquilizers.  °· Do not take medicine that has not been prescribed by your physicians.  °· Do not sign important papers or make important decisions while on narcotic pain medicines.  °· Have a responsible person with you.  ° °PAIN MANAGEMENT °· Motrin 800mg.  (This is the same as 4-200mg over the counter tablets of Motrin or ibuprofen.)  You may take this every eight hours or as needed for cramping.   °· Vicodin 5/325mg.  For more severe pain, take one or two tablets every four to six hours as needed for pain control.  (Remember that narcotic pain medications increase your risk of constipation.  If this becomes a problem, you may take an over the counter stool softener like Colace 100mg up to four times a day.) ° °DO'S AND DON'T'S °· Do not take a tub bath for one week.  You may shower on the first day after your surgery °· Do not do any heavy lifting for one to two weeks.  This increases the chance of bleeding. °· Do move around as you feel able.  Stairs are fine.  You may begin to exercise again as you feel able.  Do not lift any weights for two weeks. °· Do not put anything in the vagina for two weeks--no tampons, intercourse, or douching.   ° °REGULAR MEDIATIONS/VITAMINS: °· You may restart all of your regular medications as prescribed. °· You may restart all of your vitamins as you normally take them.   ° °PLEASE CALL OR SEEK MEDICAL CARE IF: °· You have persistent nausea and vomiting.  °· You have trouble eating or drinking.  °· You have an oral temperature above 100.5.   °· You have constipation that is not helped by adjusting diet or increasing fluid intake. Pain medicines are a common cause of constipation.  °· You have heavy vaginal bleeding ° °

## 2023-12-16 NOTE — H&P (Signed)
 Teresa Wallace is an 73 y.o. female G2P2 MWSF with abnormal appearing endometrium on ultrasound.  The ultrasound was ordered due to RLQ pain/pelvic cramping.  The ultrasound findings showed uterus measuring 4.5 x 3.4 x 2.0cm.  Endometrium was ~27mm and irregular with cystic spaces, c/w polyp.  Due to these findings, hysteroscopy with possible polyp resection, D&C planned.  She is here for this today.    Pertinent Gynecological History: Menses: PMP  Contraception: post menopausal status DES exposure: denies Blood transfusions: none Sexually transmitted diseases: no past history Previous GYN Procedures: NSVD x 2  Last mammogram: normal Date: 12/2022 Last pap: normal Date: 12/2018 OB History: G2, P2   Menstrual History: No LMP recorded. Patient is postmenopausal.    Past Medical History:  Diagnosis Date   Hemorrhoids    Hyperlipemia    Thyroid  disorder    mildly elevated TSH    History reviewed. No pertinent surgical history.  Family History  Problem Relation Age of Onset   Breast cancer Mother    Lung cancer Mother 51   Breast cancer Paternal Aunt    Breast cancer Maternal Grandmother 22   Breast cancer Paternal Grandmother 66   Breast cancer Paternal Aunt    Breast cancer Maternal Aunt    Breast cancer Maternal Aunt     Social History:  reports that she has never smoked. She has never used smokeless tobacco. She reports current alcohol  use. She reports that she does not use drugs.  Allergies:  Allergies  Allergen Reactions   Penicillin G Rash    Medications Prior to Admission  Medication Sig Dispense Refill Last Dose/Taking   atorvastatin  (LIPITOR) 20 MG tablet Take 1 tablet (20 mg total) by mouth daily. 30 tablet 3 12/15/2023   diphenhydrAMINE (BENADRYL) 25 MG tablet Take 25 mg by mouth every 6 (six) hours as needed for sleep.   Past Week   Ibuprofen-diphenhydrAMINE Cit (ADVIL PM) 200-38 MG TABS Take 1 tablet by mouth at bedtime as needed (Pain).   Past Week    Multiple Vitamins-Minerals (MULTIVITAMIN WITH MINERALS) tablet Take 2 tablets by mouth daily. Centrum gummies   Past Week   doxycycline (VIBRA-TABS) 100 MG tablet Take 200 mg by mouth once. (Patient not taking: Reported on 12/12/2023)   Not Taking   estradiol  (ESTRACE ) 0.1 MG/GM vaginal cream Apply a small amount around the urethra twice weekly (Patient not taking: Reported on 10/22/2023) 42.5 g 1 Not Taking   metroNIDAZOLE  (METROGEL ) 0.75 % vaginal gel Place 1 Applicatorful vaginally at bedtime. Use for 5 nights. (Patient not taking: Reported on 12/12/2023) 70 g 0 Not Taking    Review of Systems  Blood pressure 131/60, pulse (!) 56, temperature 97.8 F (36.6 C), temperature source Oral, resp. rate 17, height 5' 4 (1.626 m), weight 68 kg, SpO2 98%. Physical Exam Constitutional:      Appearance: Normal appearance.   Cardiovascular:     Rate and Rhythm: Normal rate and regular rhythm.  Pulmonary:     Effort: Pulmonary effort is normal.     Breath sounds: Normal breath sounds.   Neurological:     General: No focal deficit present.     Mental Status: She is alert.   Psychiatric:        Mood and Affect: Mood normal.        Behavior: Behavior normal.     Results for orders placed or performed during the hospital encounter of 12/16/23 (from the past 24 hours)  CBC  Status: Abnormal   Collection Time: 12/16/23  1:19 PM  Result Value Ref Range   WBC 6.7 4.0 - 10.5 K/uL   RBC 4.20 3.87 - 5.11 MIL/uL   Hemoglobin 13.7 12.0 - 15.0 g/dL   HCT 19.1 47.8 - 29.5 %   MCV 103.6 (H) 80.0 - 100.0 fL   MCH 32.6 26.0 - 34.0 pg   MCHC 31.5 30.0 - 36.0 g/dL   RDW 62.1 30.8 - 65.7 %   Platelets 230 150 - 400 K/uL   nRBC 0.0 0.0 - 0.2 %    No results found.  Assessment/Plan: 73 yo G2P2 MWF with h/o abnormal appearing endometrium on ultrasound here for hysteroscopy, possible polyp resection, D&C.  Questions answered.  Pt ready to proceed.  Lillian Rein 12/16/2023, 3:40 PM

## 2023-12-16 NOTE — Anesthesia Preprocedure Evaluation (Signed)
 Anesthesia Evaluation  Patient identified by MRN, date of birth, ID band Patient awake    Reviewed: Allergy & Precautions, NPO status , Patient's Chart, lab work & pertinent test results  Airway Mallampati: II   Neck ROM: Full    Dental  (+) Dental Advisory Given   Pulmonary neg pulmonary ROS   breath sounds clear to auscultation       Cardiovascular negative cardio ROS  Rhythm:Regular Rate:Normal     Neuro/Psych negative neurological ROS     GI/Hepatic negative GI ROS, Neg liver ROS,,,  Endo/Other  negative endocrine ROS    Renal/GU negative Renal ROS     Musculoskeletal   Abdominal   Peds  Hematology negative hematology ROS (+)   Anesthesia Other Findings   Reproductive/Obstetrics                             Anesthesia Physical Anesthesia Plan  ASA: 2  Anesthesia Plan: General   Post-op Pain Management: Tylenol PO (pre-op)*   Induction: Intravenous  PONV Risk Score and Plan: 3 and Ondansetron, Dexamethasone and Treatment may vary due to age or medical condition  Airway Management Planned: LMA  Additional Equipment:   Intra-op Plan:   Post-operative Plan: Extubation in OR  Informed Consent: I have reviewed the patients History and Physical, chart, labs and discussed the procedure including the risks, benefits and alternatives for the proposed anesthesia with the patient or authorized representative who has indicated his/her understanding and acceptance.     Dental advisory given  Plan Discussed with: CRNA  Anesthesia Plan Comments:        Anesthesia Quick Evaluation

## 2023-12-17 ENCOUNTER — Encounter (HOSPITAL_COMMUNITY): Payer: Self-pay | Admitting: Obstetrics & Gynecology

## 2023-12-17 NOTE — Anesthesia Postprocedure Evaluation (Signed)
 Anesthesia Post Note  [facility]  Procedure(s) Performed: DILATATION AND CURETTAGE /HYSTEROSCOPY (Vagina )     Patient location during evaluation: PACU Anesthesia Type: General Level of consciousness: awake and alert Pain management: pain level controlled Vital Signs Assessment: post-procedure vital signs reviewed and stable Respiratory status: spontaneous breathing, nonlabored ventilation, respiratory function stable and patient connected to nasal cannula oxygen Cardiovascular status: blood pressure returned to baseline and stable Postop Assessment: no apparent nausea or vomiting Anesthetic complications: no   There were no known notable events for this encounter.  Last Vitals:  Vitals:   12/16/23 1715 12/16/23 1720  BP: 130/64 126/73  Pulse: 61 61  Resp: 14 15  Temp:  36.9 C  SpO2: 98% 100%    Last Pain:  Vitals:   12/16/23 1650  TempSrc:   PainSc: 0-No pain                 Theotis Flake P Geraldyne Barraclough

## 2023-12-18 DIAGNOSIS — Z1231 Encounter for screening mammogram for malignant neoplasm of breast: Secondary | ICD-10-CM | POA: Diagnosis not present

## 2023-12-18 LAB — SURGICAL PATHOLOGY

## 2023-12-20 ENCOUNTER — Ambulatory Visit (HOSPITAL_BASED_OUTPATIENT_CLINIC_OR_DEPARTMENT_OTHER): Payer: Self-pay | Admitting: Obstetrics & Gynecology

## 2024-01-14 ENCOUNTER — Encounter (HOSPITAL_BASED_OUTPATIENT_CLINIC_OR_DEPARTMENT_OTHER): Payer: Self-pay | Admitting: Obstetrics & Gynecology

## 2024-01-14 ENCOUNTER — Ambulatory Visit (HOSPITAL_BASED_OUTPATIENT_CLINIC_OR_DEPARTMENT_OTHER): Payer: Self-pay | Admitting: Obstetrics & Gynecology

## 2024-01-14 VITALS — BP 107/57 | HR 56 | Wt 150.4 lb

## 2024-01-14 DIAGNOSIS — R935 Abnormal findings on diagnostic imaging of other abdominal regions, including retroperitoneum: Secondary | ICD-10-CM

## 2024-01-14 DIAGNOSIS — Z87440 Personal history of urinary (tract) infections: Secondary | ICD-10-CM

## 2024-01-14 MED ORDER — ESTRADIOL 0.1 MG/GM VA CREA: TOPICAL_CREAM | VAGINAL | 2 refills | Status: AC

## 2024-01-14 NOTE — Patient Instructions (Signed)
Fem dophilus (probiotic).

## 2024-01-14 NOTE — Progress Notes (Signed)
 GYNECOLOGY  VISIT  CC:   post op recheck  HPI: 73 y.o. G2P0002 Married White or Caucasian female here for recheck after undergoing hysteroscopy with D&C on 12/16/2023.  She reports bleeding is none.  She only had some spotting for one day after the procedure.  She has no pain.  Thinks she only took pain medication once.  Bowel function is Normal.  Bladder function is normal.    Unrelated, she has hx of UTIs and wants to discuss options.  Vaginal estrogen, water and probiotics discussed.    Pathology reviewed:  Yes .  Questions answered.    MEDS:   Current Outpatient Medications on File Prior to Visit  Medication Sig Dispense Refill   atorvastatin  (LIPITOR) 20 MG tablet Take 1 tablet (20 mg total) by mouth daily. 30 tablet 3   diphenhydrAMINE (BENADRYL) 25 MG tablet Take 25 mg by mouth every 6 (six) hours as needed for sleep.     estradiol  (ESTRACE ) 0.1 MG/GM vaginal cream Apply a small amount around the urethra twice weekly 42.5 g 1   HYDROcodone -acetaminophen  (NORCO/VICODIN) 5-325 MG tablet Take 1-2 tablets by mouth every 6 (six) hours as needed for moderate pain (pain score 4-6). 12 tablet 0   ibuprofen  (ADVIL ) 600 MG tablet Take 1 tablet (600 mg total) by mouth every 6 (six) hours as needed. 15 tablet 0   Ibuprofen -diphenhydrAMINE Cit (ADVIL  PM) 200-38 MG TABS Take 1 tablet by mouth at bedtime as needed (Pain).     metroNIDAZOLE  (METROGEL ) 0.75 % vaginal gel Place 1 Applicatorful vaginally at bedtime. Use for 5 nights. 70 g 0   Multiple Vitamins-Minerals (MULTIVITAMIN WITH MINERALS) tablet Take 2 tablets by mouth daily. Centrum gummies     No current facility-administered medications on file prior to visit.    SH:  Smoking No    PHYSICAL EXAMINATION:    BP (!) 107/57 (BP Location: Right Arm, Patient Position: Sitting, Cuff Size: Large)   Pulse (!) 56   Wt 150 lb 6.4 oz (68.2 kg)   SpO2 97%   BMI 25.82 kg/m     General appearance: alert, cooperative and appears stated age CV:   Regular rate and rhythm Lungs:  clear to auscultation, no wheezes, rales or rhonchi, symmetric air entry  Assessment/Plan: 1. Abnormal ultrasound of endometrium (Primary) - discussed with pt I do not think she needs any additional evaluation unless she has future vaginal bleeding.    2. History of UTI - estradiol  (ESTRACE ) 0.1 MG/GM vaginal cream; Place 1 gram pv once or twice weekly.  Dispense: 42.5 g; Refill: 2  - Femdophilus discussed as well as adequate water intake.

## 2024-02-18 DIAGNOSIS — M1712 Unilateral primary osteoarthritis, left knee: Secondary | ICD-10-CM | POA: Diagnosis not present

## 2024-04-02 ENCOUNTER — Ambulatory Visit (HOSPITAL_BASED_OUTPATIENT_CLINIC_OR_DEPARTMENT_OTHER)

## 2024-04-02 ENCOUNTER — Encounter (HOSPITAL_BASED_OUTPATIENT_CLINIC_OR_DEPARTMENT_OTHER): Payer: Self-pay

## 2024-04-02 VITALS — BP 129/75 | HR 61

## 2024-04-02 DIAGNOSIS — C4441 Basal cell carcinoma of skin of scalp and neck: Secondary | ICD-10-CM | POA: Diagnosis not present

## 2024-04-02 DIAGNOSIS — C44612 Basal cell carcinoma of skin of right upper limb, including shoulder: Secondary | ICD-10-CM | POA: Diagnosis not present

## 2024-04-02 DIAGNOSIS — C44519 Basal cell carcinoma of skin of other part of trunk: Secondary | ICD-10-CM | POA: Diagnosis not present

## 2024-04-02 DIAGNOSIS — L84 Corns and callosities: Secondary | ICD-10-CM | POA: Diagnosis not present

## 2024-04-02 DIAGNOSIS — R350 Frequency of micturition: Secondary | ICD-10-CM

## 2024-04-02 DIAGNOSIS — L570 Actinic keratosis: Secondary | ICD-10-CM | POA: Diagnosis not present

## 2024-04-02 DIAGNOSIS — Z85828 Personal history of other malignant neoplasm of skin: Secondary | ICD-10-CM | POA: Diagnosis not present

## 2024-04-02 DIAGNOSIS — D2371 Other benign neoplasm of skin of right lower limb, including hip: Secondary | ICD-10-CM | POA: Diagnosis not present

## 2024-04-02 LAB — POCT URINALYSIS DIP (CLINITEK)
Bilirubin, UA: NEGATIVE
Glucose, UA: NEGATIVE mg/dL
Ketones, POC UA: NEGATIVE mg/dL
Nitrite, UA: NEGATIVE
POC PROTEIN,UA: 100 — AB
Spec Grav, UA: >=1.03 — AB (ref 1.010–1.025)
Urobilinogen, UA: 0.2 U/dL
pH, UA: 5.5 (ref 5.0–8.0)

## 2024-04-02 MED ORDER — NITROFURANTOIN MONOHYD MACRO 100 MG PO CAPS: 100.0000 mg | ORAL_CAPSULE | Freq: Two times a day (BID) | ORAL | 0 refills | Status: DC

## 2024-04-02 NOTE — Progress Notes (Signed)
 NURSE VISIT- UTI SYMPTOMS   SUBJECTIVE:  Teresa Wallace is a 73 y.o. G78P0002 female here for UTI symptoms. She is a GYN patient. She reports urinary frequency and dysuria.  OBJECTIVE:  There were no vitals taken for this visit.  Appears well, in no apparent distress  No results found for this or any previous visit (from the past 24 hours).  ASSESSMENT: GYN patient with UTI symptoms and negative nitrites  PLAN: Visit routed to or discussed with:  Rx sent today: Yes Urine culture sent Call or return to clinic prn if these symptoms worsen or fail to improve as anticipated. Follow-up: as needed

## 2024-04-06 ENCOUNTER — Ambulatory Visit (HOSPITAL_BASED_OUTPATIENT_CLINIC_OR_DEPARTMENT_OTHER): Payer: Self-pay | Admitting: Obstetrics & Gynecology

## 2024-04-06 LAB — URINE CULTURE

## 2024-04-07 ENCOUNTER — Telehealth (HOSPITAL_BASED_OUTPATIENT_CLINIC_OR_DEPARTMENT_OTHER): Payer: Self-pay

## 2024-04-07 MED ORDER — NITROFURANTOIN MONOHYD MACRO 100 MG PO CAPS: 100.0000 mg | ORAL_CAPSULE | Freq: Two times a day (BID) | ORAL | 0 refills | Status: AC

## 2024-04-07 NOTE — Telephone Encounter (Signed)
 Called patient. DOB verified. Informed patient of results and recommendations. Patient states she is feeling a lot better since starting the antibiotic. Patient wants to know if you could send in a extra Rx as she is getting ready to leave the country. She is going to [place] on the 18th. She does not want to be out of the country and the symptoms start again and may not be able to be seen while she is there. Please advise or call in rx.  tbw

## 2024-04-08 NOTE — Telephone Encounter (Signed)
 LMOM at 9:28 for patient letting her know Rx has been sent to pharmacy. Advised patient to call office if she has any questions or concerns. tbw

## 2024-04-14 DIAGNOSIS — H02834 Dermatochalasis of left upper eyelid: Secondary | ICD-10-CM | POA: Diagnosis not present

## 2024-04-14 DIAGNOSIS — H02831 Dermatochalasis of right upper eyelid: Secondary | ICD-10-CM | POA: Diagnosis not present

## 2024-04-14 DIAGNOSIS — H18413 Arcus senilis, bilateral: Secondary | ICD-10-CM | POA: Diagnosis not present

## 2024-04-14 DIAGNOSIS — Z23 Encounter for immunization: Secondary | ICD-10-CM | POA: Diagnosis not present

## 2024-04-14 DIAGNOSIS — Z961 Presence of intraocular lens: Secondary | ICD-10-CM | POA: Diagnosis not present
